# Patient Record
Sex: Female | Born: 1980 | Race: White | Hispanic: No | Smoking: Former smoker
Health system: Southern US, Community
[De-identification: ages and names within clinical notes are randomized; demographics above are authoritative.]

## PROBLEM LIST (undated history)

## (undated) DIAGNOSIS — N809 Endometriosis, unspecified: Secondary | ICD-10-CM

## (undated) DIAGNOSIS — N800 Endometriosis of uterus: Secondary | ICD-10-CM

## (undated) DIAGNOSIS — R112 Nausea with vomiting, unspecified: Secondary | ICD-10-CM

## (undated) DIAGNOSIS — N8003 Adenomyosis of the uterus: Secondary | ICD-10-CM

## (undated) DIAGNOSIS — E049 Nontoxic goiter, unspecified: Secondary | ICD-10-CM

## (undated) DIAGNOSIS — I341 Nonrheumatic mitral (valve) prolapse: Secondary | ICD-10-CM

## (undated) DIAGNOSIS — Z9889 Other specified postprocedural states: Secondary | ICD-10-CM

## (undated) DIAGNOSIS — I499 Cardiac arrhythmia, unspecified: Secondary | ICD-10-CM

## (undated) DIAGNOSIS — E039 Hypothyroidism, unspecified: Secondary | ICD-10-CM

## (undated) HISTORY — DX: Nontoxic goiter, unspecified: E04.9

## (undated) HISTORY — DX: Adenomyosis of the uterus: N80.03

## (undated) HISTORY — DX: Nonrheumatic mitral (valve) prolapse: I34.1

## (undated) HISTORY — PX: WISDOM TOOTH EXTRACTION: SHX21

## (undated) HISTORY — DX: Endometriosis, unspecified: N80.9

## (undated) HISTORY — DX: Endometriosis of uterus: N80.0

---

## 2008-07-22 ENCOUNTER — Ambulatory Visit: Payer: Self-pay | Admitting: Family Medicine

## 2009-03-11 ENCOUNTER — Ambulatory Visit: Payer: Self-pay | Admitting: Otolaryngology

## 2010-06-04 IMAGING — CT CT NECK WITH CONTRAST
2 series · 10 of 14 positions shown, 12 images · IV contrast (agent unspecified)
Comparison: none

REASON FOR EXAM: right neck mass
COMMENTS:

PROCEDURE:     SINALOA - SINALOA NECK WITH CONTRAST  - March 11, 2009 [DATE]
RESULT:     CT of the neck is performed utilizing 75 ml of Ksovue-B76
iodinated intravenous contrast. Images are reconstructed in the axial plane
at 3 mm slice thickness.
There is no previous exam for comparison.

[Series 2: soft tissue · axial · 0.51mm/px · z∈[-370,-152]mm · 8 of 95 slices shown, 10 images]
[im 11/95  soft-tissue]
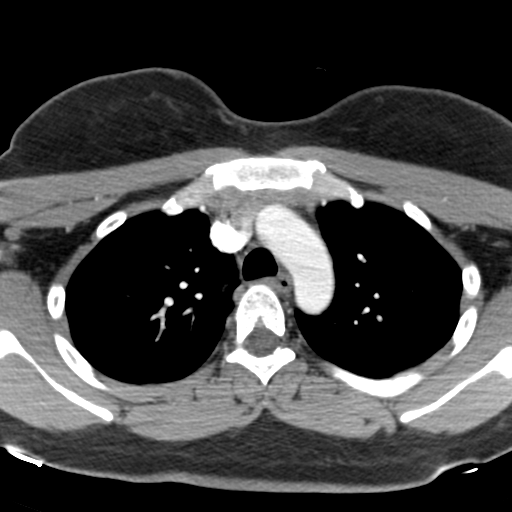
[im 11/95  bone]
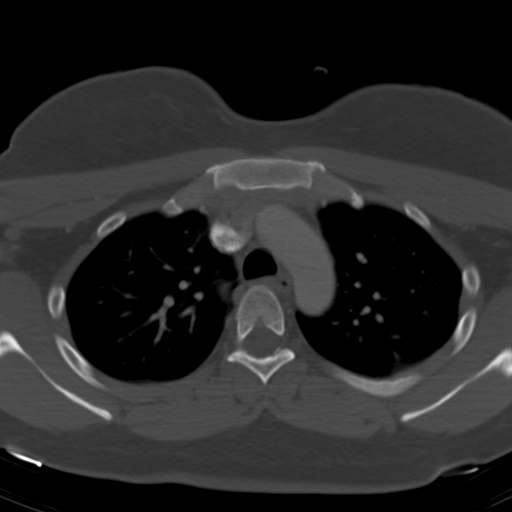
[im 21/95  bone]
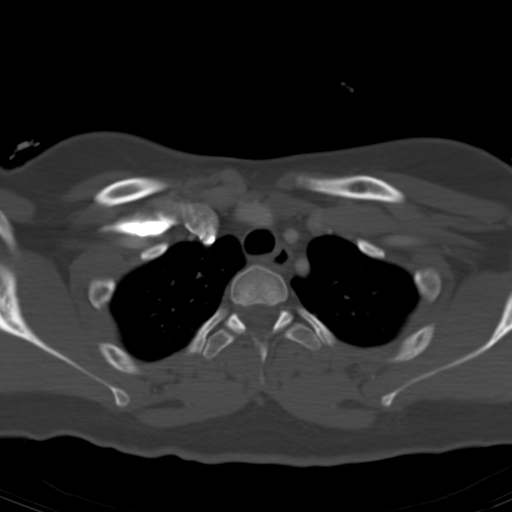
[im 32/95  bone]
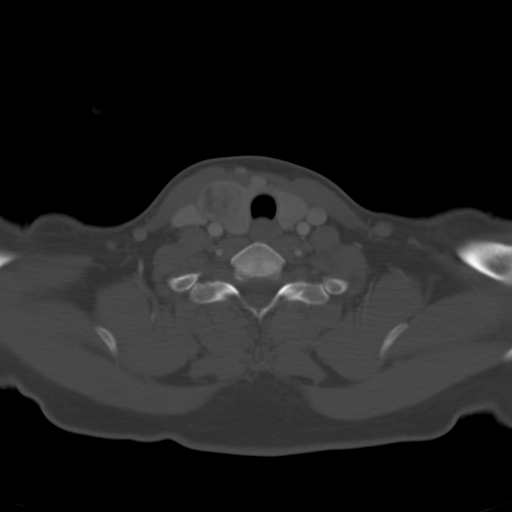
[im 42/95  bone]
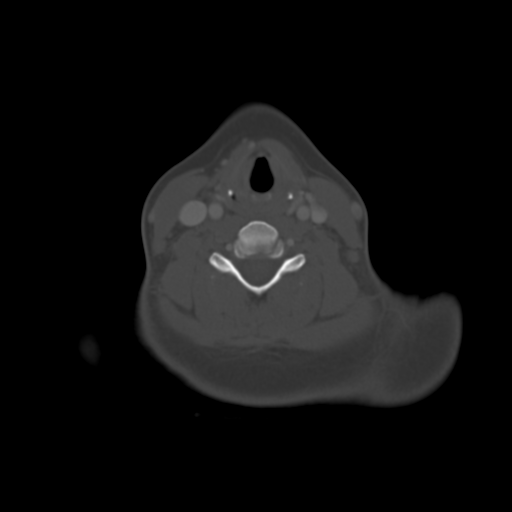
[im 53/95  soft-tissue]
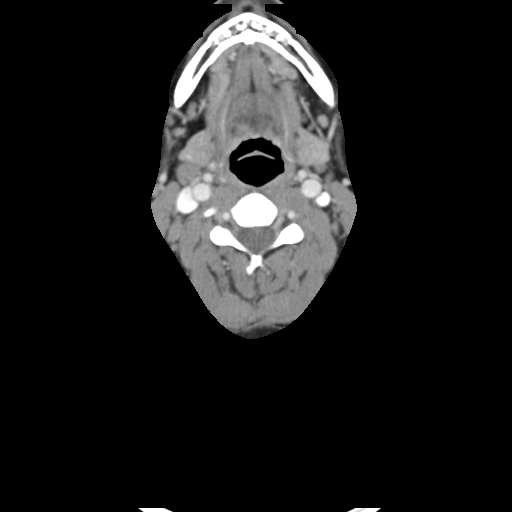
[im 53/95  bone]
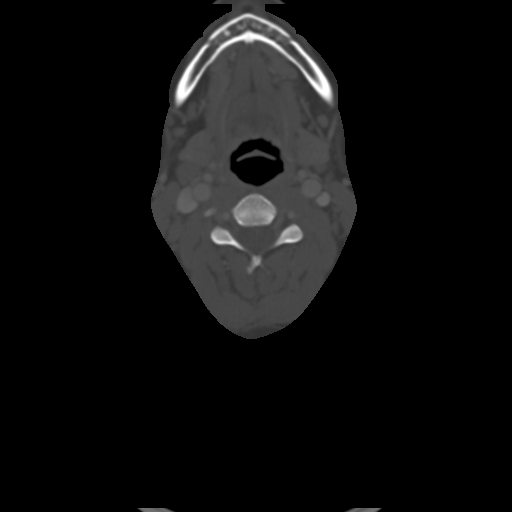
[im 63/95  bone]
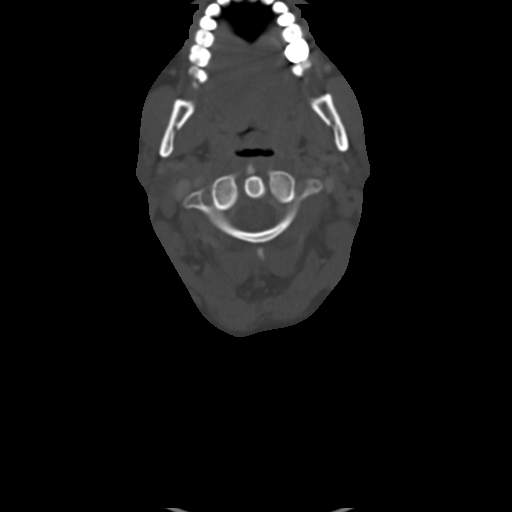
[im 74/95  bone]
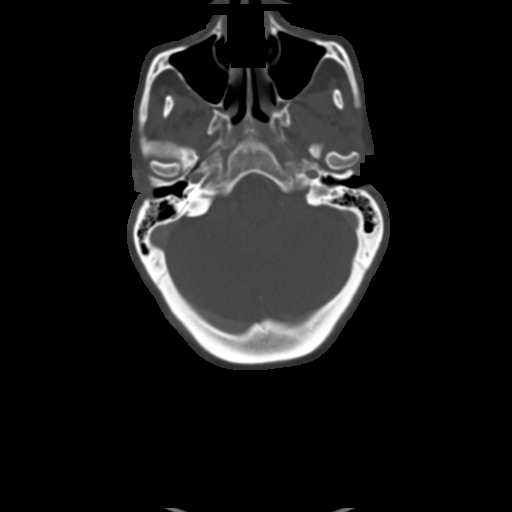
[im 84/95  bone]
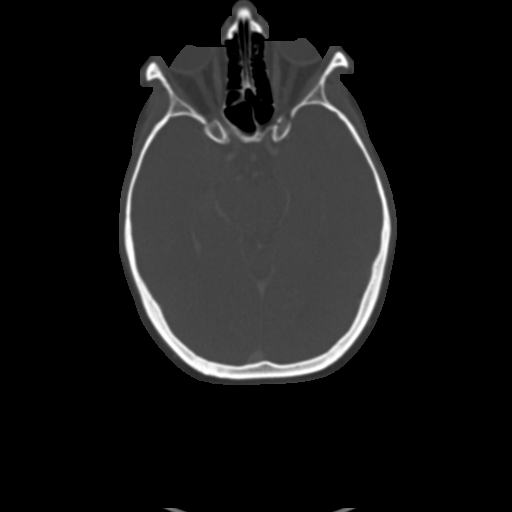

[Series 3: lung · axial · 0.61mm/px · z∈[-370,-338]mm · 2 of 33 slices shown]
[im 11/33  bone]
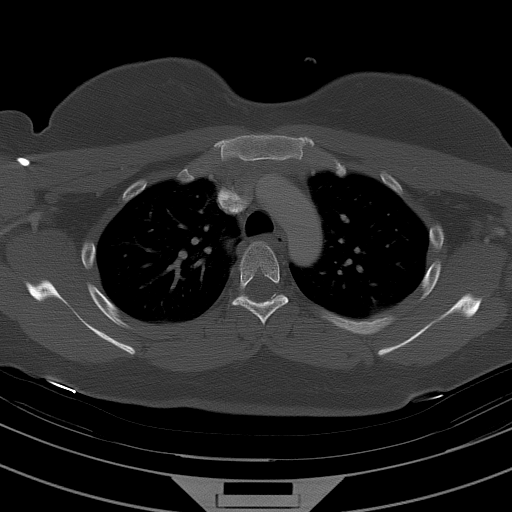
[im 22/33  bone]
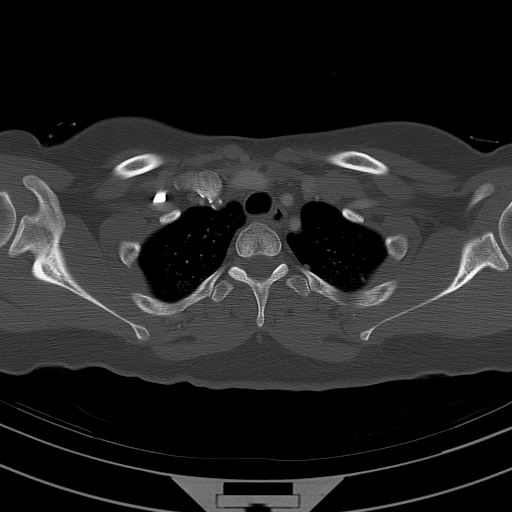

[10 of 14 positions shown; findings below may reference images not displayed]

FINDINGS: A skin marker is placed on the right neck anterolaterally on image
#59 and image #60. This overlies an enlarged, heterogeneously enhancing,
right lobe of the thyroid. There does not appear to be definite
calcification. The appearance is nonspecific. The left lobe is at the upper
limits of normal in size but smaller. Images through the base of the brain
demonstrate normal enhancement with no discrete mass. The included orbits
and sinuses are unremarkable. The nasopharyngeal and oropharyngeal soft
tissues appear to be within normal limits. The prevertebral and
parapharyngeal regions are unremarkable. The parotid and submandibular
glands appear to be within normal limits. There is no definite cervical mass
or adenopathy otherwise. There is no superior mediastinal extension of the
thyroid lobes. The superior mediastinum is unremarkable otherwise. There is
no supraclavicular mass.
IMPRESSION: Enlarged right lobe of the thyroid which is heterogeneous
in enhancement. Ultrasound could be utilized for further characterization,
if desired. Malignancy is not excluded but the appearance is nonspecific.
Statistically, the appearance is most likely secondary to multinodular
disease but sonographic correlation looking for small areas of calcification
is suggested. If calcification is present then fine needle aspiration biopsy
would be suggested, if it has not been performed already.

## 2012-08-31 HISTORY — PX: THYROIDECTOMY: SHX17

## 2014-09-04 LAB — HM PAP SMEAR: HM PAP: NORMAL

## 2016-11-13 ENCOUNTER — Ambulatory Visit (INDEPENDENT_AMBULATORY_CARE_PROVIDER_SITE_OTHER): Payer: BC Managed Care – PPO | Admitting: Obstetrics and Gynecology

## 2016-11-13 ENCOUNTER — Encounter: Payer: Self-pay | Admitting: Obstetrics and Gynecology

## 2016-11-13 VITALS — BP 100/60 | HR 63 | Ht 67.0 in | Wt 161.0 lb

## 2016-11-13 DIAGNOSIS — Z0000 Encounter for general adult medical examination without abnormal findings: Secondary | ICD-10-CM

## 2016-11-13 DIAGNOSIS — I341 Nonrheumatic mitral (valve) prolapse: Secondary | ICD-10-CM

## 2016-11-13 DIAGNOSIS — Z124 Encounter for screening for malignant neoplasm of cervix: Secondary | ICD-10-CM | POA: Diagnosis not present

## 2016-11-13 DIAGNOSIS — N8003 Adenomyosis of the uterus: Secondary | ICD-10-CM

## 2016-11-13 DIAGNOSIS — N809 Endometriosis, unspecified: Secondary | ICD-10-CM

## 2016-11-13 DIAGNOSIS — N800 Endometriosis of uterus: Secondary | ICD-10-CM | POA: Diagnosis not present

## 2016-11-13 DIAGNOSIS — L680 Hirsutism: Secondary | ICD-10-CM

## 2016-11-13 DIAGNOSIS — Z113 Encounter for screening for infections with a predominantly sexual mode of transmission: Secondary | ICD-10-CM

## 2016-11-13 NOTE — Progress Notes (Signed)
Gynecology Annual Exam  PCP: Cathey Endow, MD  Chief Complaint  Patient presents with  . Gynecologic Exam    History of Present Illness:  [name] is a 36 y.o. [phone] who LMP was Patient's last menstrual period was 11/10/2016., presents today for her annual examination.  Her menses are regular every 28-30 days, lasting 7 day(s).  Dysmenorrhea mild, occurring throughout menses. She does not have intermenstrual bleeding.  She is single partner, contraception - IUD.  Last Pap: 2 years ago  Results were: no abnormalities /neg HPV DNA neg Hx of STDs: none. But requests testing every year.  Last mammogram: n/a  Results were: n/a There is no FH of breast cancer. There is no FH of ovarian cancer. The patient does not do self-breast exams.  Tobacco use: The patient denies current or previous tobacco use. Alcohol use: none Exercise: moderately active  The patient reports facial and breast hair growth with acne. Has been tested for PCOS in the past with negative results.  Wants testing again for hyperandrogenemia.   The patient wears seatbelts: yes.      Review of Systems: Review of Systems  Constitutional: Negative.   HENT: Negative.   Eyes: Negative.   Respiratory: Negative.   Cardiovascular: Negative.   Gastrointestinal: Negative.   Genitourinary: Negative.   Musculoskeletal: Negative.   Skin:       Unwanted facial and breast hair growth  Neurological: Negative.   Endo/Heme/Allergies: Negative.   Psychiatric/Behavioral: Negative.     Past Medical History:  Diagnosis Date  . Goiter   . Mitral valve prolapse    Past Surgical History:  Procedure Laterality Date  . THYROIDECTOMY      Medications:   Medication Sig Start Date End Date Taking? Authorizing Provider  levonorgestrel (MIRENA) 20 MCG/24HR IUD 1 each by Intrauterine route once.   Yes Historical Provider, MD  levothyroxine (SYNTHROID, LEVOTHROID) 137 MCG tablet TAKE (1) TABLET BY MOUTH EVERY DAY AT  6:00 10/09/16  Yes Historical Provider, MD  Multiple Vitamin (MULTI-VITAMINS) TABS Take by mouth.    Historical Provider, MD    Allergies: No Known Allergies  Gynecologic History: Patient's last menstrual period was 11/10/2016.  Obstetric History: [id number]  Social History   Social History  . Marital status: Married    Spouse name: N/A  . Number of children: N/A  . Years of education: N/A   Occupational History  . Not on file.   Social History Main Topics  . Smoking status: Never Smoker  . Smokeless tobacco: Never Used  . Alcohol use Yes  . Drug use: No  . Sexual activity: Yes    Birth control/ protection: IUD   Other Topics Concern  . Not on file   Social History Narrative  . No narrative on file   Family History  Problem Relation Age of Onset  . Diabetes Father   . Bladder Cancer Father   . Ovarian cancer Maternal Aunt   . Breast cancer Maternal Grandmother   . Colon cancer Maternal Uncle      Physical Exam BP 100/60   Pulse 63   Ht 5\' 7"  (1.702 m)   Wt 161 lb (73 kg)   LMP 11/10/2016   BMI 25.22 kg/m   Physical Exam  Constitutional: She is oriented to person, place, and time and well-developed, well-nourished, and in no distress. No distress.  HENT:  Head: Normocephalic and atraumatic.  Eyes: Conjunctivae are normal. Left eye exhibits no discharge. No scleral icterus.  Neck: Normal range of motion. Neck supple.  Cardiovascular: Normal rate and regular rhythm.  Exam reveals no gallop and no friction rub.   No murmur heard. Pulmonary/Chest: Effort normal and breath sounds normal. No respiratory distress. She has no wheezes. She has no rales. She exhibits no mass and no tenderness. Right breast exhibits no inverted nipple, no mass, no nipple discharge, no skin change and no tenderness. Left breast exhibits no inverted nipple, no mass, no nipple discharge, no skin change and no tenderness. Breasts are symmetrical.  Abdominal: Soft. Bowel sounds are normal. She  exhibits no distension and no mass. There is no tenderness. There is no rebound and no guarding.  Genitourinary: Vagina normal, uterus normal, cervix normal, right adnexa normal, left adnexa normal and vulva normal.  Genitourinary Comments: IUD strings present  Musculoskeletal: Normal range of motion. She exhibits no edema.  Lymphadenopathy:    She has no cervical adenopathy.  Neurological: She is alert and oriented to person, place, and time. No cranial nerve deficit.  Skin: Skin is warm and dry. No rash noted.  Psychiatric: Judgment normal.     Female chaperone present for pelvic and breast  portions of the physical exam  Results: AUDIT Questionnaire (screen for alcoholism): 2 PHQ-9: 5  Assessment: 36 y.o. [id number] here for routine annual exam.   Plan: 1. Adenomyosis Continue IUD  3. Hirsutism - 17-Hydroxyprogesterone - DHEA-sulfate - Hgb A1c w/o eAG - Prolactin - Testosterone,Free and Total  4. Laboratory exam ordered as part of routine general medical examination - Lipid Panel With LDL/HDL Ratio  5. Pap smear for cervical cancer screening - IGP,CtNg,AptimaHPV,rfx16/18,45  6. Screen for STD (sexually transmitted disease) - IGP,CtNg,AptimaHPV,rfx16/18,45   7) STI screening was offered: accepted  8) Pap - ASCCP guidelines and rational discussed.  Patient opts for yearly screening interval  9) Routine healthcare maintenance including cholesterol, diabetes screening done today as part of PCOS panel  10) Follow up 1 year for routine annual exam  Prentice Docker, MD 11/13/2016 2:46 PM

## 2016-11-18 LAB — 17-HYDROXYPROGESTERONE: 17 HYDROXYPROGESTERONE: 36 ng/dL

## 2016-11-18 LAB — TESTOSTERONE,FREE AND TOTAL
Testosterone, Free: 2.2 pg/mL (ref 0.0–4.2)
Testosterone: 35 ng/dL (ref 8–48)

## 2016-11-18 LAB — LIPID PANEL WITH LDL/HDL RATIO
Cholesterol, Total: 179 mg/dL (ref 100–199)
HDL: 52 mg/dL (ref >39)
LDL CALC: 107 mg/dL — AB (ref 0–99)
LDL/HDL RATIO: 2.1 ratio (ref 0.0–3.2)
TRIGLYCERIDES: 98 mg/dL (ref 0–149)
VLDL CHOLESTEROL CAL: 20 mg/dL (ref 5–40)

## 2016-11-18 LAB — PROLACTIN: Prolactin: 10.6 ng/mL (ref 4.8–23.3)

## 2016-11-18 LAB — HGB A1C W/O EAG: Hgb A1c MFr Bld: 4.6 % — ABNORMAL LOW (ref 4.8–5.6)

## 2016-11-18 LAB — DHEA-SULFATE: DHEA SO4: 184.9 ug/dL (ref 57.3–279.2)

## 2016-11-19 ENCOUNTER — Telehealth: Payer: Self-pay

## 2016-11-19 ENCOUNTER — Encounter: Payer: Self-pay | Admitting: Obstetrics and Gynecology

## 2016-11-19 DIAGNOSIS — L680 Hirsutism: Secondary | ICD-10-CM

## 2016-11-19 NOTE — Telephone Encounter (Signed)
Pt returning SDJ regarding lab results.

## 2016-11-19 NOTE — Telephone Encounter (Signed)
Spoke w/pt. Notified that SDJ replied to patients My chart message. Patient states she hasn't received a notification of this as of yet. She would like a call back from SDJ to discuss.

## 2016-11-20 DIAGNOSIS — L680 Hirsutism: Secondary | ICD-10-CM | POA: Insufficient documentation

## 2016-11-20 MED ORDER — NORGESTIMATE-ETH ESTRADIOL 0.25-35 MG-MCG PO TABS: 1.0000 | ORAL_TABLET | Freq: Every day | ORAL | 4 refills | Status: DC

## 2016-11-20 NOTE — Telephone Encounter (Signed)
Discussed results. Difficult to make definitive diagnosis of PCOS overall. 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

## 2016-11-27 LAB — IGP,CTNG,APTIMAHPV,RFX16/18,45
CHLAMYDIA, NUC. ACID AMP: NEGATIVE
Gonococcus by Nucleic Acid Amp: NEGATIVE
HPV Aptima: POSITIVE — AB
HPV Genotype 16: NEGATIVE
HPV Genotype 18,45: NEGATIVE
PAP SMEAR COMMENT: 0

## 2016-12-01 ENCOUNTER — Telehealth: Payer: Self-pay | Admitting: Obstetrics and Gynecology

## 2016-12-01 ENCOUNTER — Encounter: Payer: Self-pay | Admitting: Obstetrics and Gynecology

## 2016-12-01 NOTE — Telephone Encounter (Signed)
Spoke with patient and conveyed pap smear results. Answered all questions. Recommended follow up in 1 year for repeat pap smear with HPV.

## 2017-02-10 ENCOUNTER — Encounter: Payer: Self-pay | Admitting: Obstetrics and Gynecology

## 2017-02-19 ENCOUNTER — Encounter: Payer: Self-pay | Admitting: Obstetrics and Gynecology

## 2017-09-07 ENCOUNTER — Other Ambulatory Visit: Payer: Self-pay | Admitting: Obstetrics and Gynecology

## 2017-09-07 DIAGNOSIS — L680 Hirsutism: Secondary | ICD-10-CM

## 2017-11-15 ENCOUNTER — Encounter: Payer: Self-pay | Admitting: Obstetrics and Gynecology

## 2017-11-15 ENCOUNTER — Ambulatory Visit (INDEPENDENT_AMBULATORY_CARE_PROVIDER_SITE_OTHER): Payer: BC Managed Care – PPO | Admitting: Obstetrics and Gynecology

## 2017-11-15 VITALS — BP 122/78 | Ht 67.0 in | Wt 170.0 lb

## 2017-11-15 DIAGNOSIS — Z1339 Encounter for screening examination for other mental health and behavioral disorders: Secondary | ICD-10-CM | POA: Diagnosis not present

## 2017-11-15 DIAGNOSIS — Z01419 Encounter for gynecological examination (general) (routine) without abnormal findings: Secondary | ICD-10-CM | POA: Diagnosis not present

## 2017-11-15 DIAGNOSIS — Z124 Encounter for screening for malignant neoplasm of cervix: Secondary | ICD-10-CM | POA: Diagnosis not present

## 2017-11-15 DIAGNOSIS — L680 Hirsutism: Secondary | ICD-10-CM

## 2017-11-15 DIAGNOSIS — N809 Endometriosis, unspecified: Secondary | ICD-10-CM

## 2017-11-15 DIAGNOSIS — Z1331 Encounter for screening for depression: Secondary | ICD-10-CM

## 2017-11-15 DIAGNOSIS — N800 Endometriosis of uterus: Secondary | ICD-10-CM | POA: Diagnosis not present

## 2017-11-15 DIAGNOSIS — N8003 Adenomyosis of the uterus: Secondary | ICD-10-CM

## 2017-11-15 DIAGNOSIS — Z113 Encounter for screening for infections with a predominantly sexual mode of transmission: Secondary | ICD-10-CM

## 2017-11-15 MED ORDER — NORGESTIMATE-ETH ESTRADIOL 0.25-35 MG-MCG PO TABS: 1.0000 | ORAL_TABLET | Freq: Every day | ORAL | 4 refills | Status: DC

## 2017-11-15 NOTE — Progress Notes (Signed)
Gynecology Annual Exam   PCP: Gale Journey, MD  Chief Complaint  Patient presents with  . Annual Exam   History of Present Illness:  [name] is a 37 y.o. [id number] who LMP was Patient's last menstrual period was 11/08/2017., presents today for her annual examination.  Her menses are regular every 28-30 days, lasting 7 day(s).  Dysmenorrhea moderate, occurring first 1-2 days of flow. She does have intermenstrual bleeding, occasionally.  She is functional, but is tired of dealing with having to take medications.  Her bleeding has been a long-standing issue for her.  Her IUD has helped greatly and she has used a variety of combined OCPs to manage the overall bleeding issue for several years. She is believed to have adenomyosis based on ultrasound.  She is single partner, contraception - IUD. Ortho-cyclen.   Last Pap: 1 year  Results were: no abnormalities /neg HPV DNA Positive (HPV 16 and 18 negative).  Hx of STDs: none  Last mammogram: n/a There is no FH of breast cancer. There is no FH of ovarian cancer. The patient does not do self-breast exams.  Tobacco use: The patient denies current or previous tobacco use. Alcohol use: social drinker Exercise: moderately active  The patient wears seatbelts: yes.      Contraception, Mirena, HPV results, surgery  She is seeing psychiatry for burnout symptoms.  She is taking no medications.  She scored #9 a "1" due to a "thought pattern." she says that she is safe and would never hurt herself. She has recently started her visits to a psychiatrist.   Past Medical History:  Diagnosis Date  . Adenomyosis   . Goiter   . Mitral valve prolapse     Past Surgical History:  Procedure Laterality Date  . THYROIDECTOMY      Prior to Admission medications   Medication Sig Start Date End Date Taking? Authorizing Provider  levonorgestrel (MIRENA) 20 MCG/24HR IUD 1 each by Intrauterine route once.   Yes [provider]    levothyroxine (SYNTHROID, LEVOTHROID) 137 MCG tablet TAKE (1) TABLET BY MOUTH EVERY DAY AT 6:00 10/09/16  Yes [provider]  Multiple Vitamin (MULTI-VITAMINS) TABS Take by mouth.   Yes [provider]  norgestimate-ethinyl estradiol (ORTHO-CYCLEN,SPRINTEC,PREVIFEM) 0.25-35 MG-MCG tablet Take 1 tablet by mouth daily. 11/20/16  Yes Will Bonnet, MD   Allergies: No Known Allergies  Gynecologic History: Patient's last menstrual period was 11/08/2017.  Obstetric History: [id number]  Social History   Socioeconomic History  . Marital status: Married    Spouse name: Not on file  . Number of children: Not on file  . Years of education: Not on file  . Highest education level: Not on file  Social Needs  . Financial resource strain: Not on file  . Food insecurity - worry: Not on file  . Food insecurity - inability: Not on file  . Transportation needs - medical: Not on file  . Transportation needs - non-medical: Not on file  Occupational History  . Not on file  Tobacco Use  . Smoking status: Never Smoker  . Smokeless tobacco: Never Used  Substance and Sexual Activity  . Alcohol use: Yes  . Drug use: No  . Sexual activity: Yes    Birth control/protection: IUD  Other Topics Concern  . Not on file  Social History Narrative  . Not on file    Family History  Problem Relation Age of Onset  . Diabetes Father   . Bladder  Cancer Father   . Ovarian cancer Maternal Aunt   . Breast cancer Maternal Grandmother   . Colon cancer Maternal Uncle     Review of Systems  Constitutional: Negative.   HENT: Negative.   Eyes: Negative.   Respiratory: Negative.   Cardiovascular: Negative.   Gastrointestinal: Negative.   Genitourinary: Negative.   Musculoskeletal: Negative.   Skin: Negative.   Neurological: Negative.   Psychiatric/Behavioral: Negative.      Physical Exam BP 122/78   Ht 5\' 7"  (1.702 m)   Wt 170 lb (77.1 kg)   LMP 11/08/2017   BMI 26.63 kg/m     Physical Exam  Constitutional: She is oriented to person, place, and time. She appears well-developed and well-nourished. No distress.  Genitourinary: Uterus normal. Pelvic exam was performed with patient supine. There is no rash, tenderness, lesion or injury on the right labia. There is no rash, tenderness, lesion or injury on the left labia. No erythema, tenderness or bleeding in the vagina. No signs of injury around the vagina. No vaginal discharge found. Right adnexum does not display mass, does not display tenderness and does not display fullness. Left adnexum does not display mass, does not display tenderness and does not display fullness. Cervix does not exhibit motion tenderness, lesion, discharge or polyp.   Uterus is mobile and anteverted. Uterus is not enlarged, tender or exhibiting a mass.  HENT:  Head: Normocephalic and atraumatic.  Eyes: EOM are normal. No scleral icterus.  Neck: Normal range of motion. Neck supple. No thyromegaly present.  Cardiovascular: Normal rate and regular rhythm. Exam reveals no gallop and no friction rub.  No murmur heard. Pulmonary/Chest: Effort normal and breath sounds normal. No respiratory distress. She has no wheezes. She has no rales. Right breast exhibits no inverted nipple, no mass, no nipple discharge, no skin change and no tenderness. Left breast exhibits no inverted nipple, no mass, no nipple discharge, no skin change and no tenderness.  Abdominal: Soft. Bowel sounds are normal. She exhibits no distension and no mass. There is no tenderness. There is no rebound and no guarding.  Musculoskeletal: Normal range of motion. She exhibits no edema or tenderness.  Lymphadenopathy:    She has no cervical adenopathy.       Right: No inguinal adenopathy present.       Left: No inguinal adenopathy present.  Neurological: She is alert and oriented to person, place, and time. No cranial nerve deficit.  Skin: Skin is warm and dry. No rash noted. No erythema.   Psychiatric: She has a normal mood and affect. Her behavior is normal. Judgment normal.   Female chaperone present for pelvic and breast  portions of the physical exam  Results: AUDIT Questionnaire (screen for alcoholism): 1 PHQ-9: 11 (seeing a psychiatrist)  Assessment: 37 y.o. [phone] female here for routine annual gynecologic examination.  Plan: Problem List Items Addressed This Visit      Musculoskeletal and Integument   Hirsutism   Relevant Medications   norgestimate-ethinyl estradiol (ORTHO-CYCLEN,SPRINTEC,PREVIFEM) 0.25-35 MG-MCG tablet     Other   Adenomyosis   Relevant Medications   norgestimate-ethinyl estradiol (ORTHO-CYCLEN,SPRINTEC,PREVIFEM) 0.25-35 MG-MCG tablet    Other Visit Diagnoses    Women's annual routine gynecological examination    -  Primary   Relevant Orders   IGP,CtNgTv,Apt HPV,rfx16/18,45   Screening for depression       Screening for alcoholism       Pap smear for cervical cancer screening       Relevant  Orders   IGP,CtNgTv,Apt HPV,rfx16/18,45   Screen for STD (sexually transmitted disease)       Relevant Orders   IGP,CtNgTv,Apt HPV,rfx16/18,45     Screening: -- Blood pressure screen normal -- Colonoscopy - not due -- Mammogram - not due -- Weight screening: normal -- Depression screening negative (PHQ-9) -- Nutrition: normal -- cholesterol screening: per PCP -- osteoporosis screening: not due -- tobacco screening: not using -- alcohol screening: AUDIT questionnaire indicates low-risk usage. -- family history of breast cancer screening: done. not at high risk. -- no evidence of domestic violence or intimate partner violence. -- STD screening: gonorrhea/chlamydia NAAT collected -- pap smear collected per ASCCP guidelines -- flu vaccine received at work -- HPV vaccination series: not eligilbe   Adenomyosis: discussed in detail. She is ready for surgery. She will consider and may time surgery (hysterectomy) for summer. WIll continue to  monitor in the mean time. Her IUD is due to come out in 03/2018.    Prentice Docker, MD 11/15/2017 1:27 PM

## 2017-11-18 LAB — IGP,CTNGTV,APT HPV,RFX16/18,45
Chlamydia, Nuc. Acid Amp: NEGATIVE
Gonococcus, Nuc. Acid Amp: NEGATIVE
HPV Aptima: NEGATIVE
PAP Smear Comment: 0
TRICH VAG BY NAA: NEGATIVE

## 2017-11-22 ENCOUNTER — Encounter: Payer: Self-pay | Admitting: Obstetrics and Gynecology

## 2017-11-24 ENCOUNTER — Telehealth: Payer: Self-pay | Admitting: Obstetrics and Gynecology

## 2017-11-24 NOTE — Telephone Encounter (Signed)
-----   Message from Will Bonnet, MD sent at 11/23/2017  3:10 PM EDT ----- Regarding: Schedule surgery Surgery Booking Request Patient Full Name:  Andrea Decker  MRN: [id number]  DOB: [birth year]  Surgeon: Prentice Docker, MD  Requested Surgery Date and Time: mid July, on the same day another MD is in the OR with me (pt shooting for week of 7/15) Primary Diagnosis AND Code: adenomyosis, menorrhagia Secondary Diagnosis and Code:  Surgical Procedure: TLH/BS, cystoscopy L&D Notification: No Admission Status: same day surgery Length of Surgery: 2 hours Special Case Needs: none H&P: tbd (date) Phone Interview???: no Interpreter: Language:  Medical Clearance: no Special Scheduling Instructions: none

## 2017-11-24 NOTE — Telephone Encounter (Signed)
-----   Message from Will Bonnet, MD sent at 11/23/2017  3:10 PM EDT ----- Regarding: Schedule surgery Surgery Booking Request Patient Full Name:  Andrea Decker  MRN: [id number]  DOB: [birth year]  Surgeon: Prentice Docker, MD  Requested Surgery Date and Time: mid July, on the same day another MD is in the OR with me (pt shooting for week of 7/15) Primary Diagnosis AND Code: adenomyosis, menorrhagia Secondary Diagnosis and Code:  Surgical Procedure: TLH/BS, cystoscopy L&D Notification: No Admission Status: same day surgery Length of Surgery: 2 hours Special Case Needs: none H&P: tbd (date) Phone Interview???: no Interpreter: Language:  Medical Clearance: no Special Scheduling Instructions: none

## 2017-11-24 NOTE — Telephone Encounter (Signed)
Lmtrc

## 2017-11-25 NOTE — Telephone Encounter (Signed)
Patient is aware of H&P at [facility] on 03/04/18 @ 8:50am, Pre-admit Testing afterwards, and OR on 03/15/18. Patient is aware she may expect calls from the [facility] and [facility]. Patient has my ext.

## 2017-11-25 NOTE — Telephone Encounter (Signed)
Lmtrc

## 2017-12-05 ENCOUNTER — Encounter: Payer: Self-pay | Admitting: Obstetrics and Gynecology

## 2018-01-09 ENCOUNTER — Encounter: Payer: Self-pay | Admitting: Obstetrics and Gynecology

## 2018-02-10 ENCOUNTER — Telehealth: Payer: Self-pay

## 2018-02-10 NOTE — Telephone Encounter (Signed)
FMLA/DISABILITY form for [employer] filled out, signature obtained, and given to [place] for processing.

## 2018-02-14 ENCOUNTER — Telehealth: Payer: Self-pay | Admitting: Obstetrics and Gynecology

## 2018-02-14 NOTE — Telephone Encounter (Signed)
Lm for pt letting her know that her FMLA paperwork has been faxed.

## 2018-03-04 ENCOUNTER — Other Ambulatory Visit: Payer: Self-pay

## 2018-03-04 ENCOUNTER — Encounter
Admission: RE | Admit: 2018-03-04 | Discharge: 2018-03-04 | Disposition: A | Discharge status: Home / Self Care | Payer: BC Managed Care – PPO | Source: Ambulatory Visit | Attending: Obstetrics and Gynecology | Admitting: Obstetrics and Gynecology

## 2018-03-04 ENCOUNTER — Ambulatory Visit (INDEPENDENT_AMBULATORY_CARE_PROVIDER_SITE_OTHER): Payer: BC Managed Care – PPO | Admitting: Obstetrics and Gynecology

## 2018-03-04 ENCOUNTER — Encounter: Payer: Self-pay | Admitting: Obstetrics and Gynecology

## 2018-03-04 VITALS — BP 104/68 | HR 69 | Ht 67.0 in | Wt 169.0 lb

## 2018-03-04 DIAGNOSIS — N921 Excessive and frequent menstruation with irregular cycle: Secondary | ICD-10-CM

## 2018-03-04 DIAGNOSIS — Z01812 Encounter for preprocedural laboratory examination: Secondary | ICD-10-CM | POA: Diagnosis present

## 2018-03-04 DIAGNOSIS — N800 Endometriosis of uterus: Secondary | ICD-10-CM

## 2018-03-04 DIAGNOSIS — N809 Endometriosis, unspecified: Secondary | ICD-10-CM

## 2018-03-04 DIAGNOSIS — N8003 Adenomyosis of the uterus: Secondary | ICD-10-CM

## 2018-03-04 HISTORY — DX: Nausea with vomiting, unspecified: R11.2

## 2018-03-04 HISTORY — DX: Other specified postprocedural states: Z98.890

## 2018-03-04 LAB — CBC
HEMATOCRIT: 42.7 % (ref 35.0–47.0)
Hemoglobin: 14.9 g/dL (ref 12.0–16.0)
MCH: 29.3 pg (ref 26.0–34.0)
MCHC: 34.9 g/dL (ref 32.0–36.0)
MCV: 84.1 fL (ref 80.0–100.0)
PLATELETS: 260 10*3/uL (ref 150–440)
RBC: 5.08 MIL/uL (ref 3.80–5.20)
RDW: 13.2 % (ref 11.5–14.5)
WBC: 6.6 10*3/uL (ref 3.6–11.0)

## 2018-03-04 LAB — TYPE AND SCREEN
ABO/RH(D): AB POS
Antibody Screen: NEGATIVE

## 2018-03-04 LAB — COMPREHENSIVE METABOLIC PANEL
ALT: 27 U/L (ref 0–44)
AST: 20 U/L (ref 15–41)
Albumin: 4.1 g/dL (ref 3.5–5.0)
Alkaline Phosphatase: 52 U/L (ref 38–126)
Anion gap: 7 (ref 5–15)
BILIRUBIN TOTAL: 0.9 mg/dL (ref 0.3–1.2)
BUN: 14 mg/dL (ref 6–20)
CO2: 28 mmol/L (ref 22–32)
CREATININE: 0.68 mg/dL (ref 0.44–1.00)
Calcium: 8.9 mg/dL (ref 8.9–10.3)
Chloride: 103 mmol/L (ref 98–111)
GFR calc Af Amer: >60 mL/min (ref >60)
Glucose, Bld: 92 mg/dL (ref 70–99)
Potassium: 4 mmol/L (ref 3.5–5.1)
Sodium: 138 mmol/L (ref 135–145)
TOTAL PROTEIN: 7 g/dL (ref 6.5–8.1)

## 2018-03-04 LAB — T4, FREE: Free T4: 1.12 ng/dL (ref 0.82–1.77)

## 2018-03-04 LAB — TSH: TSH: 1.539 u[IU]/mL (ref 0.350–4.500)

## 2018-03-04 NOTE — Progress Notes (Signed)
Preoperative History and Physical  ASHELEY Decker is a 37 y.o. [phone] here for surgical management of menorrhagia with irregular cycle and adenomyosis.   No significant preoperative concerns.  History of Present Illness: 37 y.o. G22P2013 female who has a long-standing history of abnormal uterine bleeding. She has been treated with a Mirena IUD and combined oral contraceptives.  Even with this, she has breakthrough bleeding symptoms.  She had a pelvic ultrasound in the past that showed findings concerning for adenomyosis.  Her Mirena was placed in 03/2013.  Last pap smear 1 year ago, NILM, hPV negative (HPV+ 2 years ago)  Proposed surgery: total laparoscopic hysterectomy, bilateral salgpingectomy, cystoscopy  Past Medical History:  Diagnosis Date  . Adenomyosis   . Goiter   . Mitral valve prolapse    Past Surgical History:  Procedure Laterality Date  . THYROIDECTOMY     OB History  Gravida Para Term Preterm AB Living  5 3 2   1 3   SAB TAB Ectopic Multiple Live Births  1            # Outcome Date GA Lbr Len/2nd Weight Sex Delivery Anes PTL Lv  5 Term 12/12/07 [redacted]w[redacted]d  5 lb 2.1 oz (2.327 kg) M Vag-Spont     4 Term 09/03/05 [redacted]w[redacted]d  5 lb 1.9 oz (2.322 kg) M Vag-Spont     3 Para 12/30/98 [redacted]w[redacted]d  8 lb (3.629 kg)  Vag-Spont     2 Gravida           1 SAB           Patient denies any other pertinent gynecologic issues.   Current Outpatient Medications on File Prior to Visit  Medication Sig Dispense Refill  . levonorgestrel (MIRENA) 20 MCG/24HR IUD 1 each by Intrauterine route once.    Andrea Decker levothyroxine (SYNTHROID, LEVOTHROID) 137 MCG tablet TAKE (1) TABLET BY MOUTH EVERY DAY AT 6:00    . norgestimate-ethinyl estradiol (ORTHO-CYCLEN,SPRINTEC,PREVIFEM) 0.25-35 MG-MCG tablet Take 1 tablet by mouth daily. 3 Package 4   No current facility-administered medications on file prior to visit.    No Known Allergies  Social History:   reports that she quit smoking about 4 years ago. Her smoking use  included cigarettes. She has a 17.00 pack-year smoking history. She has never used smokeless tobacco. She reports that she drinks alcohol. She reports that she does not use drugs.  Family History  Problem Relation Age of Onset  . Diabetes Father   . Bladder Cancer Father   . Ovarian cancer Maternal Aunt   . Breast cancer Maternal Grandmother   . Colon cancer Maternal Uncle     Review of Systems: Noncontributory  PHYSICAL EXAM: Blood pressure 104/68, pulse 69, height 5\' 7"  (1.702 m), weight 169 lb (76.7 kg), SpO2 99 %. CONSTITUTIONAL: Well-developed, well-nourished female in no acute distress.  HENT:  Normocephalic, atraumatic, External right and left ear normal. Oropharynx is clear and moist EYES: Conjunctivae and EOM are normal. Pupils are equal, round, and reactive to light. No scleral icterus.  NECK: Normal range of motion, supple, no masses SKIN: Skin is warm and dry. No rash noted. Not diaphoretic. No erythema. No pallor. [place]: Alert and oriented to person, place, and time. Normal reflexes, muscle tone coordination. No cranial nerve deficit noted. PSYCHIATRIC: Normal mood and affect. Normal behavior. Normal judgment and thought content. CARDIOVASCULAR: Normal heart rate noted, regular rhythm RESPIRATORY: Effort and breath sounds normal, no problems with respiration noted ABDOMEN: Soft, nontender, nondistended.  PELVIC: Deferred MUSCULOSKELETAL: Normal range of motion. No edema and no tenderness. 2+ distal pulses.  Assessment: Patient Active Problem List   Diagnosis Date Noted  . Menorrhagia with irregular cycle 03/04/2018  . Adenomyosis     Plan: Patient will undergo surgical management with TLH/BS/Cystoscopy.   The risks of surgery were discussed in detail with the patient including but not limited to: bleeding which may require transfusion or reoperation; infection which may require antibiotics; injury to surrounding organs which may involve bowel, bladder, ureters ;  need for additional procedures including laparoscopy or laparotomy; thromboembolic phenomenon, surgical site problems and other postoperative/anesthesia complications. Likelihood of success in alleviating the patient's condition was discussed. Routine postoperative instructions will be reviewed with the patient and her family in detail after surgery.  The patient concurred with the proposed plan, giving informed written consent for the surgery.  Preoperative prophylactic antibiotics, as indicated, and SCDs ordered on call to the OR.    Prentice Docker, MD 03/04/2018 9:48 AM

## 2018-03-04 NOTE — H&P (View-Only) (Signed)
Preoperative History and Physical  Andrea Decker is a 37 y.o. [phone] here for surgical management of menorrhagia with irregular cycle and adenomyosis.   No significant preoperative concerns.  History of Present Illness: 37 y.o. G60P2013 female who has a long-standing history of abnormal uterine bleeding. She has been treated with a Mirena IUD and combined oral contraceptives.  Even with this, she has breakthrough bleeding symptoms.  She had a pelvic ultrasound in the past that showed findings concerning for adenomyosis.  Her Mirena was placed in 03/2013.  Last pap smear 1 year ago, NILM, hPV negative (HPV+ 2 years ago)  Proposed surgery: total laparoscopic hysterectomy, bilateral salgpingectomy, cystoscopy  Past Medical History:  Diagnosis Date  . Adenomyosis   . Goiter   . Mitral valve prolapse    Past Surgical History:  Procedure Laterality Date  . THYROIDECTOMY     OB History  Gravida Para Term Preterm AB Living  5 3 2   1 3   SAB TAB Ectopic Multiple Live Births  1            # Outcome Date GA Lbr Len/2nd Weight Sex Delivery Anes PTL Lv  5 Term 12/12/07 [redacted]w[redacted]d  5 lb 2.1 oz (2.327 kg) M Vag-Spont     4 Term 09/03/05 [redacted]w[redacted]d  5 lb 1.9 oz (2.322 kg) M Vag-Spont     3 Para 12/30/98 [redacted]w[redacted]d  8 lb (3.629 kg)  Vag-Spont     2 Gravida           1 SAB           Patient denies any other pertinent gynecologic issues.   Current Outpatient Medications on File Prior to Visit  Medication Sig Dispense Refill  . levonorgestrel (MIRENA) 20 MCG/24HR IUD 1 each by Intrauterine route once.    Marland Kitchen levothyroxine (SYNTHROID, LEVOTHROID) 137 MCG tablet TAKE (1) TABLET BY MOUTH EVERY DAY AT 6:00    . norgestimate-ethinyl estradiol (ORTHO-CYCLEN,SPRINTEC,PREVIFEM) 0.25-35 MG-MCG tablet Take 1 tablet by mouth daily. 3 Package 4   No current facility-administered medications on file prior to visit.    No Known Allergies  Social History:   reports that she quit smoking about 4 years ago. Her smoking use  included cigarettes. She has a 17.00 pack-year smoking history. She has never used smokeless tobacco. She reports that she drinks alcohol. She reports that she does not use drugs.  Family History  Problem Relation Age of Onset  . Diabetes Father   . Bladder Cancer Father   . Ovarian cancer Maternal Aunt   . Breast cancer Maternal Grandmother   . Colon cancer Maternal Uncle     Review of Systems: Noncontributory  PHYSICAL EXAM: Blood pressure 104/68, pulse 69, height 5\' 7"  (1.702 m), weight 169 lb (76.7 kg), SpO2 99 %. CONSTITUTIONAL: Well-developed, well-nourished female in no acute distress.  HENT:  Normocephalic, atraumatic, External right and left ear normal. Oropharynx is clear and moist EYES: Conjunctivae and EOM are normal. Pupils are equal, round, and reactive to light. No scleral icterus.  NECK: Normal range of motion, supple, no masses SKIN: Skin is warm and dry. No rash noted. Not diaphoretic. No erythema. No pallor. [place]: Alert and oriented to person, place, and time. Normal reflexes, muscle tone coordination. No cranial nerve deficit noted. PSYCHIATRIC: Normal mood and affect. Normal behavior. Normal judgment and thought content. CARDIOVASCULAR: Normal heart rate noted, regular rhythm RESPIRATORY: Effort and breath sounds normal, no problems with respiration noted ABDOMEN: Soft, nontender, nondistended.  PELVIC: Deferred MUSCULOSKELETAL: Normal range of motion. No edema and no tenderness. 2+ distal pulses.  Assessment: Patient Active Problem List   Diagnosis Date Noted  . Menorrhagia with irregular cycle 03/04/2018  . Adenomyosis     Plan: Patient will undergo surgical management with TLH/BS/Cystoscopy.   The risks of surgery were discussed in detail with the patient including but not limited to: bleeding which may require transfusion or reoperation; infection which may require antibiotics; injury to surrounding organs which may involve bowel, bladder, ureters ;  need for additional procedures including laparoscopy or laparotomy; thromboembolic phenomenon, surgical site problems and other postoperative/anesthesia complications. Likelihood of success in alleviating the patient's condition was discussed. Routine postoperative instructions will be reviewed with the patient and her family in detail after surgery.  The patient concurred with the proposed plan, giving informed written consent for the surgery.  Preoperative prophylactic antibiotics, as indicated, and SCDs ordered on call to the OR.    Prentice Docker, MD 03/04/2018 9:48 AM

## 2018-03-04 NOTE — Patient Instructions (Signed)
Your procedure is scheduled on: Tuesday 03/15/18 Report to [facility]. To find out your arrival time please call [phone] between 1PM - 3PM on Monday 03/14/18.  Remember: Instructions that are not followed completely may result in serious medical risk, up to and including death, or upon the discretion of your surgeon and anesthesiologist your surgery may need to be rescheduled.     _X__ 1. Do not eat food after midnight the night before your procedure.                 No gum chewing or hard candies. You may drink clear liquids up to 2 hours                 before you are scheduled to arrive for your surgery- DO not drink clear                 liquids within 2 hours of the start of your surgery.                 Clear Liquids include:  water, apple juice without pulp, clear carbohydrate                 drink such as Clearfast or Gatorade, Black Coffee or Tea (Do not add                 anything to coffee or tea).  __X__2.  On the morning of surgery brush your teeth with toothpaste and water, you                 may rinse your mouth with mouthwash if you wish.  Do not swallow any              toothpaste of mouthwash.     _X__ 3.  No Alcohol for 24 hours before or after surgery.   _X__ 4.  Do Not Smoke or use e-cigarettes For 24 Hours Prior to Your Surgery.                 Do not use any chewable tobacco products for at least 6 hours prior to                 surgery.  ____  5.  Bring all medications with you on the day of surgery if instructed.   __X__  6.  Notify your doctor if there is any change in your medical condition      (cold, fever, infections).     Do not wear jewelry, make-up, hairpins, clips or nail polish. Do not wear lotions, powders, or perfumes.  Do not shave 48 hours prior to surgery. Men may shave face and neck. Do not bring valuables to the hospital.    [facility] is not responsible for any belongings or  valuables.  Contacts, dentures/partials or body piercings may not be worn into surgery. Bring a case for your contacts, glasses or hearing aids, a denture cup will be supplied. Leave your suitcase in the car. After surgery it may be brought to your room. For patients admitted to the hospital, discharge time is determined by your treatment team.   Patients discharged the day of surgery will not be allowed to drive home.   Please read over the following fact sheets that you were given:   MRSA Information  __X__ Take these medicines the morning of surgery with A SIP OF WATER:  1. Levothyroxine  2.   3.   4.  5.  6.  ____ Fleet Enema (as directed)   __X__ Use CHG Soap/SAGE wipes as directed  ____ Use inhalers on the day of surgery  ____ Stop metformin/Janumet/Farxiga 2 days prior to surgery    ____ Take 1/2 of usual insulin dose the night before surgery. No insulin the morning          of surgery.   ____ Stop Blood Thinners Coumadin/Plavix/Xarelto/Pleta/Pradaxa/Eliquis/Effient/Aspirin  on   Or contact your Surgeon, Cardiologist or Medical Doctor regarding  ability to stop your blood thinners  __X__ Stop Anti-inflammatories 7 days before surgery such as Advil, Ibuprofen, Motrin,  BC or Goodies Powder, Naprosyn, Naproxen, Aleve, Aspirin TYLENOL OK   __X__ Stop all herbal supplements, fish oil or vitamin E until after surgery.    ____ Bring C-Pap to the hospital.

## 2018-03-07 ENCOUNTER — Encounter: Payer: Self-pay | Admitting: Obstetrics and Gynecology

## 2018-03-14 MED ORDER — CEFAZOLIN SODIUM-DEXTROSE 2-4 GM/100ML-% IV SOLN
2.0000 g | INTRAVENOUS | Status: AC
  Administered 2018-03-15: 2 g via INTRAVENOUS

## 2018-03-15 ENCOUNTER — Ambulatory Visit: Payer: BC Managed Care – PPO | Admitting: Certified Registered"

## 2018-03-15 ENCOUNTER — Encounter
Admission: RE | Disposition: A | Discharge status: Home / Self Care | Payer: Self-pay | Source: Ambulatory Visit | Attending: Obstetrics and Gynecology

## 2018-03-15 ENCOUNTER — Encounter: Payer: Self-pay | Admitting: Certified Registered"

## 2018-03-15 ENCOUNTER — Ambulatory Visit
Admission: RE | Admit: 2018-03-15 | Discharge: 2018-03-15 | Disposition: A | Discharge status: Home / Self Care | Payer: BC Managed Care – PPO | Source: Ambulatory Visit | Attending: Obstetrics and Gynecology | Admitting: Obstetrics and Gynecology

## 2018-03-15 DIAGNOSIS — N800 Endometriosis of uterus: Secondary | ICD-10-CM | POA: Diagnosis not present

## 2018-03-15 DIAGNOSIS — E049 Nontoxic goiter, unspecified: Secondary | ICD-10-CM | POA: Diagnosis not present

## 2018-03-15 DIAGNOSIS — Z8041 Family history of malignant neoplasm of ovary: Secondary | ICD-10-CM | POA: Insufficient documentation

## 2018-03-15 DIAGNOSIS — Z79899 Other long term (current) drug therapy: Secondary | ICD-10-CM | POA: Insufficient documentation

## 2018-03-15 DIAGNOSIS — Z8052 Family history of malignant neoplasm of bladder: Secondary | ICD-10-CM | POA: Diagnosis not present

## 2018-03-15 DIAGNOSIS — N8003 Adenomyosis of the uterus: Secondary | ICD-10-CM | POA: Diagnosis present

## 2018-03-15 DIAGNOSIS — Z87891 Personal history of nicotine dependence: Secondary | ICD-10-CM | POA: Insufficient documentation

## 2018-03-15 DIAGNOSIS — Z833 Family history of diabetes mellitus: Secondary | ICD-10-CM | POA: Diagnosis not present

## 2018-03-15 DIAGNOSIS — D251 Intramural leiomyoma of uterus: Secondary | ICD-10-CM | POA: Diagnosis not present

## 2018-03-15 DIAGNOSIS — Z803 Family history of malignant neoplasm of breast: Secondary | ICD-10-CM | POA: Insufficient documentation

## 2018-03-15 DIAGNOSIS — N920 Excessive and frequent menstruation with regular cycle: Secondary | ICD-10-CM | POA: Diagnosis present

## 2018-03-15 DIAGNOSIS — Z30432 Encounter for removal of intrauterine contraceptive device: Secondary | ICD-10-CM | POA: Diagnosis not present

## 2018-03-15 DIAGNOSIS — N921 Excessive and frequent menstruation with irregular cycle: Secondary | ICD-10-CM | POA: Diagnosis not present

## 2018-03-15 DIAGNOSIS — I341 Nonrheumatic mitral (valve) prolapse: Secondary | ICD-10-CM | POA: Insufficient documentation

## 2018-03-15 DIAGNOSIS — N809 Endometriosis, unspecified: Secondary | ICD-10-CM

## 2018-03-15 DIAGNOSIS — N838 Other noninflammatory disorders of ovary, fallopian tube and broad ligament: Secondary | ICD-10-CM | POA: Insufficient documentation

## 2018-03-15 DIAGNOSIS — Z800 Family history of malignant neoplasm of digestive organs: Secondary | ICD-10-CM | POA: Diagnosis not present

## 2018-03-15 HISTORY — DX: Hypothyroidism, unspecified: E03.9

## 2018-03-15 HISTORY — DX: Cardiac arrhythmia, unspecified: I49.9

## 2018-03-15 HISTORY — PX: CYSTOSCOPY: SHX5120

## 2018-03-15 HISTORY — PX: LAPAROSCOPIC HYSTERECTOMY: SHX1926

## 2018-03-15 LAB — POCT PREGNANCY, URINE: Preg Test, Ur: NEGATIVE

## 2018-03-15 LAB — ABO/RH: ABO/RH(D): AB POS

## 2018-03-15 SURGERY — HYSTERECTOMY, TOTAL, LAPAROSCOPIC
Anesthesia: General

## 2018-03-15 MED ORDER — MIDAZOLAM HCL 2 MG/2ML IJ SOLN
INTRAMUSCULAR | Status: DC | PRN
  Administered 2018-03-15: 3 mg via INTRAVENOUS
  Administered 2018-03-15: 2 mg via INTRAVENOUS

## 2018-03-15 MED ORDER — FENTANYL CITRATE (PF) 100 MCG/2ML IJ SOLN
INTRAMUSCULAR | Status: DC | PRN
  Administered 2018-03-15 (×3): 50 ug via INTRAVENOUS
  Administered 2018-03-15: 100 ug via INTRAVENOUS

## 2018-03-15 MED ORDER — SUGAMMADEX SODIUM 200 MG/2ML IV SOLN
INTRAVENOUS | Status: DC | PRN
  Administered 2018-03-15: 200 mg via INTRAVENOUS

## 2018-03-15 MED ORDER — PHENYLEPHRINE HCL 10 MG/ML IJ SOLN
INTRAMUSCULAR | Status: AC
  Filled 2018-03-15: qty 1

## 2018-03-15 MED ORDER — FENTANYL CITRATE (PF) 100 MCG/2ML IJ SOLN
25.0000 ug | INTRAMUSCULAR | Status: DC | PRN
  Administered 2018-03-15: 25 ug via INTRAVENOUS

## 2018-03-15 MED ORDER — GLYCOPYRROLATE 0.2 MG/ML IJ SOLN
INTRAMUSCULAR | Status: AC
  Filled 2018-03-15: qty 1

## 2018-03-15 MED ORDER — EPHEDRINE SULFATE 50 MG/ML IJ SOLN
INTRAMUSCULAR | Status: AC
  Filled 2018-03-15: qty 1

## 2018-03-15 MED ORDER — LIDOCAINE HCL (CARDIAC) PF 100 MG/5ML IV SOSY
PREFILLED_SYRINGE | INTRAVENOUS | Status: DC | PRN
  Administered 2018-03-15: 50 mg via INTRAVENOUS

## 2018-03-15 MED ORDER — PROPOFOL 10 MG/ML IV BOLUS
INTRAVENOUS | Status: DC | PRN
  Administered 2018-03-15: 150 mg via INTRAVENOUS
  Administered 2018-03-15: 50 mg via INTRAVENOUS

## 2018-03-15 MED ORDER — KETAMINE HCL 10 MG/ML IJ SOLN
INTRAMUSCULAR | Status: DC | PRN
  Administered 2018-03-15: 30 mg via INTRAVENOUS
  Administered 2018-03-15: 20 mg via INTRAVENOUS

## 2018-03-15 MED ORDER — ONDANSETRON 8 MG PO TBDP: 8.0000 mg | ORAL_TABLET | Freq: Three times a day (TID) | ORAL | 0 refills | Status: AC | PRN

## 2018-03-15 MED ORDER — DEXAMETHASONE SODIUM PHOSPHATE 10 MG/ML IJ SOLN
INTRAMUSCULAR | Status: AC
Start: 2018-03-15 — End: ?
  Filled 2018-03-15: qty 1

## 2018-03-15 MED ORDER — ROCURONIUM BROMIDE 100 MG/10ML IV SOLN
INTRAVENOUS | Status: DC | PRN
  Administered 2018-03-15: 50 mg via INTRAVENOUS
  Administered 2018-03-15: 20 mg via INTRAVENOUS

## 2018-03-15 MED ORDER — OXYCODONE-ACETAMINOPHEN 5-325 MG PO TABS: 1.0000 | ORAL_TABLET | ORAL | 0 refills | Status: AC | PRN

## 2018-03-15 MED ORDER — LIDOCAINE HCL (PF) 2 % IJ SOLN
INTRAMUSCULAR | Status: AC
Start: 2018-03-15 — End: ?
  Filled 2018-03-15: qty 10

## 2018-03-15 MED ORDER — OXYCODONE-ACETAMINOPHEN 5-325 MG PO TABS
1.0000 | ORAL_TABLET | ORAL | Status: DC | PRN
  Administered 2018-03-15: 1 via ORAL

## 2018-03-15 MED ORDER — LACTATED RINGERS IV SOLN
INTRAVENOUS | Status: DC
  Administered 2018-03-15 (×2): via INTRAVENOUS

## 2018-03-15 MED ORDER — HYDROMORPHONE HCL 1 MG/ML IJ SOLN
INTRAMUSCULAR | Status: DC | PRN
  Administered 2018-03-15: 1 mg via INTRAVENOUS

## 2018-03-15 MED ORDER — PHENYLEPHRINE HCL 10 MG/ML IJ SOLN
INTRAMUSCULAR | Status: DC | PRN
  Administered 2018-03-15: 100 ug via INTRAVENOUS

## 2018-03-15 MED ORDER — PROPOFOL 500 MG/50ML IV EMUL
INTRAVENOUS | Status: AC
  Filled 2018-03-15: qty 50

## 2018-03-15 MED ORDER — FAMOTIDINE 20 MG PO TABS
ORAL_TABLET | ORAL | Status: AC
  Filled 2018-03-15: qty 1

## 2018-03-15 MED ORDER — ROCURONIUM BROMIDE 50 MG/5ML IV SOLN
INTRAVENOUS | Status: AC
  Filled 2018-03-15: qty 2

## 2018-03-15 MED ORDER — ACETAMINOPHEN NICU IV SYRINGE 10 MG/ML
INTRAVENOUS | Status: AC
  Filled 2018-03-15: qty 1

## 2018-03-15 MED ORDER — OXYCODONE-ACETAMINOPHEN 5-325 MG PO TABS
ORAL_TABLET | ORAL | Status: AC
  Filled 2018-03-15: qty 1

## 2018-03-15 MED ORDER — MIDAZOLAM HCL 5 MG/5ML IJ SOLN
INTRAMUSCULAR | Status: AC
  Filled 2018-03-15: qty 5

## 2018-03-15 MED ORDER — ONDANSETRON HCL 4 MG/2ML IJ SOLN: 4.0000 mg | Freq: Once | INTRAMUSCULAR | Status: DC | PRN

## 2018-03-15 MED ORDER — FENTANYL CITRATE (PF) 250 MCG/5ML IJ SOLN
INTRAMUSCULAR | Status: AC
  Filled 2018-03-15: qty 5

## 2018-03-15 MED ORDER — FAMOTIDINE 20 MG PO TABS: 20.0000 mg | ORAL_TABLET | Freq: Once | ORAL | Status: DC

## 2018-03-15 MED ORDER — SCOPOLAMINE 1 MG/3DAYS TD PT72
1.0000 | MEDICATED_PATCH | Freq: Once | TRANSDERMAL | Status: DC
  Administered 2018-03-15: 1.5 mg via TRANSDERMAL

## 2018-03-15 MED ORDER — HYDROMORPHONE HCL 1 MG/ML IJ SOLN
INTRAMUSCULAR | Status: AC
  Filled 2018-03-15: qty 1

## 2018-03-15 MED ORDER — ONDANSETRON HCL 4 MG/2ML IJ SOLN
INTRAMUSCULAR | Status: DC | PRN
  Administered 2018-03-15 (×2): 4 mg via INTRAVENOUS

## 2018-03-15 MED ORDER — EPHEDRINE SULFATE 50 MG/ML IJ SOLN
INTRAMUSCULAR | Status: DC | PRN
  Administered 2018-03-15: 10 mg via INTRAVENOUS

## 2018-03-15 MED ORDER — KETAMINE HCL 50 MG/ML IJ SOLN
INTRAMUSCULAR | Status: AC
  Filled 2018-03-15: qty 10

## 2018-03-15 MED ORDER — DEXAMETHASONE SODIUM PHOSPHATE 10 MG/ML IJ SOLN
INTRAMUSCULAR | Status: DC | PRN
  Administered 2018-03-15: 10 mg via INTRAVENOUS

## 2018-03-15 MED ORDER — GLYCOPYRROLATE 0.2 MG/ML IJ SOLN
INTRAMUSCULAR | Status: DC | PRN
  Administered 2018-03-15: 0.2 mg via INTRAVENOUS

## 2018-03-15 MED ORDER — FENTANYL CITRATE (PF) 100 MCG/2ML IJ SOLN
INTRAMUSCULAR | Status: AC
  Administered 2018-03-15: 25 ug via INTRAVENOUS
  Filled 2018-03-15: qty 2

## 2018-03-15 MED ORDER — ONDANSETRON HCL 4 MG/2ML IJ SOLN
INTRAMUSCULAR | Status: AC
  Filled 2018-03-15: qty 4

## 2018-03-15 MED ORDER — PROPOFOL 500 MG/50ML IV EMUL
INTRAVENOUS | Status: DC | PRN
  Administered 2018-03-15: 125 ug/kg/min via INTRAVENOUS
  Administered 2018-03-15: 11:00:00 via INTRAVENOUS

## 2018-03-15 MED ORDER — KETOROLAC TROMETHAMINE 30 MG/ML IJ SOLN
INTRAMUSCULAR | Status: DC | PRN
  Administered 2018-03-15: 30 mg via INTRAVENOUS

## 2018-03-15 MED ORDER — CEFAZOLIN SODIUM-DEXTROSE 2-4 GM/100ML-% IV SOLN
INTRAVENOUS | Status: AC
  Filled 2018-03-15: qty 100

## 2018-03-15 MED ORDER — ACETAMINOPHEN 10 MG/ML IV SOLN
INTRAVENOUS | Status: DC | PRN
  Administered 2018-03-15: 1000 mg via INTRAVENOUS

## 2018-03-15 MED ORDER — SUGAMMADEX SODIUM 200 MG/2ML IV SOLN
INTRAVENOUS | Status: AC
  Filled 2018-03-15: qty 2

## 2018-03-15 MED ORDER — BUPIVACAINE HCL (PF) 0.5 % IJ SOLN
INTRAMUSCULAR | Status: AC
  Filled 2018-03-15: qty 30

## 2018-03-15 MED ORDER — IBUPROFEN 600 MG PO TABS: 600.0000 mg | ORAL_TABLET | Freq: Four times a day (QID) | ORAL | 0 refills | Status: AC

## 2018-03-15 MED ORDER — BUPIVACAINE HCL 0.5 % IJ SOLN
INTRAMUSCULAR | Status: DC | PRN
  Administered 2018-03-15: 14 mL

## 2018-03-15 MED ORDER — SCOPOLAMINE 1 MG/3DAYS TD PT72
MEDICATED_PATCH | TRANSDERMAL | Status: AC
  Filled 2018-03-15: qty 1

## 2018-03-15 SURGICAL SUPPLY — 59 items
BAG URINE DRAINAGE (UROLOGICAL SUPPLIES) ×4 IMPLANT
BLADE SURG SZ11 CARB STEEL (BLADE) ×4 IMPLANT
CATH FOLEY 2WAY  5CC 16FR (CATHETERS) ×2
CATH ROBINSON RED A/P 16FR (CATHETERS) ×4 IMPLANT
CATH URTH 16FR FL 2W BLN LF (CATHETERS) ×2 IMPLANT
CHLORAPREP W/TINT 26ML (MISCELLANEOUS) ×4 IMPLANT
DEFOGGER SCOPE WARMER CLEARIFY (MISCELLANEOUS) ×4 IMPLANT
DERMABOND ADVANCED (GAUZE/BANDAGES/DRESSINGS) ×2
DERMABOND ADVANCED .7 DNX12 (GAUZE/BANDAGES/DRESSINGS) ×2 IMPLANT
DEVICE SUTURE ENDOST 10MM (ENDOMECHANICALS) ×4 IMPLANT
DRAPE LEGGINS SURG 28X43 STRL (DRAPES) ×4 IMPLANT
DRAPE SHEET LG 3/4 BI-LAMINATE (DRAPES) ×4 IMPLANT
DRAPE UNDER BUTTOCK W/FLU (DRAPES) ×4 IMPLANT
GLOVE BIO SURGEON STRL SZ7 (GLOVE) ×12 IMPLANT
GLOVE BIOGEL PI IND STRL 7.5 (GLOVE) ×2 IMPLANT
GLOVE BIOGEL PI INDICATOR 7.5 (GLOVE) ×2
GLOVE INDICATOR 7.5 STRL GRN (GLOVE) ×12 IMPLANT
GOWN STRL REUS W/ TWL LRG LVL3 (GOWN DISPOSABLE) ×6 IMPLANT
GOWN STRL REUS W/ TWL XL LVL3 (GOWN DISPOSABLE) ×2 IMPLANT
GOWN STRL REUS W/TWL LRG LVL3 (GOWN DISPOSABLE) ×6
GOWN STRL REUS W/TWL XL LVL3 (GOWN DISPOSABLE) ×2
GRASPER SUT TROCAR 14GX15 (MISCELLANEOUS) ×4 IMPLANT
IRRIGATION STRYKERFLOW (MISCELLANEOUS) ×2 IMPLANT
IRRIGATOR STRYKERFLOW (MISCELLANEOUS) ×4
IV LACTATED RINGERS 1000ML (IV SOLUTION) ×12 IMPLANT
KIT PINK PAD W/HEAD ARE REST (MISCELLANEOUS) ×4
KIT PINK PAD W/HEAD ARM REST (MISCELLANEOUS) ×2 IMPLANT
KIT TURNOVER CYSTO (KITS) ×4 IMPLANT
LABEL OR SOLS (LABEL) ×4 IMPLANT
LIGASURE VESSEL 5MM BLUNT TIP (ELECTROSURGICAL) ×8 IMPLANT
MANIPULATOR VCARE LG CRV RETR (MISCELLANEOUS) ×4 IMPLANT
MANIPULATOR VCARE SML CRV RETR (MISCELLANEOUS) IMPLANT
MANIPULATOR VCARE STD CRV RETR (MISCELLANEOUS) IMPLANT
NEEDLE HYPO 22GX1.5 SAFETY (NEEDLE) ×4 IMPLANT
OCCLUDER COLPOPNEUMO (BALLOONS) ×4 IMPLANT
PACK LAP CHOLECYSTECTOMY (MISCELLANEOUS) ×4 IMPLANT
PAD OB MATERNITY 4.3X12.25 (PERSONAL CARE ITEMS) ×4 IMPLANT
PAD PREP 24X41 OB/GYN DISP (PERSONAL CARE ITEMS) ×4 IMPLANT
PORT ACCESS TROCAR AIRSEAL 12 (TROCAR) ×2 IMPLANT
PORT ACCESS TROCAR AIRSEAL 5M (TROCAR) ×2
SCISSORS METZENBAUM CVD 33 (INSTRUMENTS) ×4 IMPLANT
SET CYSTO W/LG BORE CLAMP LF (SET/KITS/TRAYS/PACK) ×4 IMPLANT
SET TRI-LUMEN FLTR TB AIRSEAL (TUBING) ×4 IMPLANT
SLEEVE ENDOPATH XCEL 5M (ENDOMECHANICALS) ×4 IMPLANT
SOL PREP PVP 2OZ (MISCELLANEOUS) ×4
SOLUTION ELECTROLUBE (MISCELLANEOUS) ×4 IMPLANT
SOLUTION PREP PVP 2OZ (MISCELLANEOUS) ×2 IMPLANT
SPONGE XRAY 4X4 16PLY STRL (MISCELLANEOUS) ×4 IMPLANT
SURGILUBE 2OZ TUBE FLIPTOP (MISCELLANEOUS) ×4 IMPLANT
SUT ENDO VLOC 180-0-8IN (SUTURE) ×4 IMPLANT
SUT MNCRL 4-0 (SUTURE) ×2
SUT MNCRL 4-0 27XMFL (SUTURE) ×2
SUT VIC AB 0 CT1 36 (SUTURE) ×4 IMPLANT
SUTURE MNCRL 4-0 27XMF (SUTURE) ×2 IMPLANT
SYR 10ML LL (SYRINGE) ×8 IMPLANT
SYR 50ML LL SCALE MARK (SYRINGE) ×4 IMPLANT
TROCAR ENDO BLADELESS 11MM (ENDOMECHANICALS) IMPLANT
TROCAR XCEL NON-BLD 5MMX100MML (ENDOMECHANICALS) ×4 IMPLANT
TUBING INSUF HEATED (TUBING) ×4 IMPLANT

## 2018-03-15 NOTE — Interval H&P Note (Signed)
History and Physical Interval Note:  03/15/2018 8:56 AM  Andrea Decker  has presented today for surgery, with the diagnosis of ADENOMYOSIS,MENORRHAGIA  The various methods of treatment have been discussed with the patient and family. After consideration of risks, benefits and other options for treatment, the patient has consented to  Procedure(s): HYSTERECTOMY TOTAL LAPAROSCOPIC BILATERAL SALPINGECTOMY (Bilateral) CYSTOSCOPY (N/A) as a surgical intervention .  The patient's history has been reviewed, patient examined, no change in status, stable for surgery.  I have reviewed the patient's chart and labs.  Questions were answered to the patient's satisfaction.    Prentice Docker, MD, [facility], [facility] 03/15/2018 8:56 AM

## 2018-03-15 NOTE — Anesthesia Preprocedure Evaluation (Signed)
Anesthesia Evaluation  Patient identified by MRN, date of birth, ID band Patient awake    Reviewed: Allergy & Precautions, H&P , NPO status , Patient's Chart, lab work & pertinent test results, reviewed documented beta blocker date and time   History of Anesthesia Complications (+) PONV and history of anesthetic complications  Airway Mallampati: II  TM Distance: >3 FB Neck ROM: full    Dental  (+) Teeth Intact   Pulmonary neg pulmonary ROS, former smoker,    Pulmonary exam normal        Cardiovascular Exercise Tolerance: Good negative cardio ROS Normal cardiovascular exam+ dysrhythmias  Rhythm:regular Rate:Normal     Neuro/Psych negative neurological ROS  negative psych ROS   GI/Hepatic negative GI ROS, Neg liver ROS,   Endo/Other  negative endocrine ROSHypothyroidism   Renal/GU negative Renal ROS  negative genitourinary   Musculoskeletal   Abdominal   Peds  Hematology negative hematology ROS (+)   Anesthesia Other Findings Past Medical History: No date: Adenomyosis No date: Dysrhythmia     Comment:  hx of svt, evaluated by md but nothing ongoing now No date: Goiter No date: Hypothyroidism No date: Mitral valve prolapse No date: PONV (postoperative nausea and vomiting)     Comment:  nauseated Past Surgical History: 2014: THYROIDECTOMY No date: WISDOM TOOTH EXTRACTION BMI    Body Mass Index:  25.69 kg/m     Reproductive/Obstetrics negative OB ROS                             Anesthesia Physical Anesthesia Plan  ASA: II  Anesthesia Plan: General ETT   Post-op Pain Management:    Induction:   PONV Risk Score and Plan: 4 or greater  Airway Management Planned:   Additional Equipment:   Intra-op Plan:   Post-operative Plan:   Informed Consent: I have reviewed the patients History and Physical, chart, labs and discussed the procedure including the risks, benefits and  alternatives for the proposed anesthesia with the patient or authorized representative who has indicated his/her understanding and acceptance.   Dental Advisory Given  Plan Discussed with: CRNA  Anesthesia Plan Comments:         Anesthesia Quick Evaluation

## 2018-03-15 NOTE — Discharge Instructions (Signed)
AMBULATORY SURGERY  °DISCHARGE INSTRUCTIONS ° ° °1) The drugs that you were given will stay in your system until tomorrow so for the next 24 hours you should not: ° °A) Drive an automobile °B) Make any legal decisions °C) Drink any alcoholic beverage ° ° °2) You may resume regular meals tomorrow.  Today it is better to start with liquids and gradually work up to solid foods. ° °You may eat anything you prefer, but it is better to start with liquids, then soup and crackers, and gradually work up to solid foods. ° ° °3) Please notify your doctor immediately if you have any unusual bleeding, trouble breathing, redness and pain at the surgery site, drainage, fever, or pain not relieved by medication. ° ° ° °4) Additional Instructions: ° ° ° ° ° ° ° °Please contact your physician with any problems or Same Day Surgery at [phone], Monday through Friday 6 am to 4 pm, or [facility] Main number at [phone]. °

## 2018-03-15 NOTE — Transfer of Care (Signed)
Immediate Anesthesia Transfer of Care Note  Patient: Andrea Decker  Procedure(s) Performed: HYSTERECTOMY TOTAL LAPAROSCOPIC BILATERAL SALPINGECTOMY (Bilateral ) CYSTOSCOPY (N/A )  Patient Location: PACU  Anesthesia Type:General  Level of Consciousness: drowsy and patient cooperative  Airway & Oxygen Therapy: Patient Spontanous Breathing and Patient connected to face mask oxygen  Post-op Assessment: Report given to RN, Post -op Vital signs reviewed and stable and Patient moving all extremities  Post vital signs: Reviewed and stable  Last Vitals:  Vitals Value Taken Time  BP 114/73 03/15/2018 11:30 AM  Temp 36.6 C 03/15/2018 11:28 AM  Pulse 64 03/15/2018 11:30 AM  Resp 15 03/15/2018 11:30 AM  SpO2 100 % 03/15/2018 11:30 AM  Vitals shown include unvalidated device data.  Last Pain:  Vitals:   03/15/18 0820  TempSrc: Oral         Complications: No apparent anesthesia complications

## 2018-03-15 NOTE — Anesthesia Post-op Follow-up Note (Signed)
Anesthesia QCDR form completed.        

## 2018-03-15 NOTE — OR Nursing (Signed)
Discharge instructions discussed with pt and husband. Both voice understanding. 

## 2018-03-15 NOTE — Op Note (Addendum)
Operative Note    Pre-Operative Diagnosis:  1) menorrhagia with irregular cycle 2) Adenomyosis  Post-OperativeDiagnosis:  1) menorrhagia with irregular cycle 2) Adenomyosis  Procedures:  1) Total Laparoscopic hysterectomy, bilateral salpingectomy 2) Cystoscopy 3) Removal of Mirena intrauterine device   Primary Surgeon: Prentice Docker, MD   Assistant Surgeon: Dr. Adrian Prows; No other capable assistant available, in surgery requiring high level assistant.  EBL: 100 mL   IVF: 1,500 mL crystalloid  Urine output: 500 mL clear urine at end of case  Specimens: uterus with cervix and bilateral fallopian tubes  Drains: none  Complications: None   Disposition: PACU   Condition: Stable   Findings:  1) normal appearing uterus, fallopian tubes (with paratubal cysts), and ovaries 2) no evidence of bladder damage on cystoscopy with bilateral efflux of urine from the ureteral orifices  Procedure Summary:  The patient was taken to the operating room where general anesthesia was administered and found to be adequate. She was placed in the dorsal supine lithotomy position in Dupont stirrups and prepped and draped in usual sterile fashion. After a timeout was called, an indwelling catheter was placed in her bladder. A sterile speculum was placed in the vagina.  The Mirena IUD was removed intact using a ring forcep by grasping the string and applying gentle traction.  A single-tooth tenaculum was used to grasp the anterior lip of the cervix. A V-Care uterine manipulator was affixed to the uterus in accordance to the manufacturers recommendations. The speculum and tenaculum were removed from the vagina.  Attention was turned to the abdomen where, after injection of local anesthetic, a 5 mm infraumbilical incision was made with the scalpel. Entry into the abdomen was obtained via Optiview trocar technique (a blunt entry technique with camera visualization through the obturator upon  entry). Verification of entry into the abdomen was obtained using opening pressures. The abdomen was insufflated with CO2. The camera was introduced through the trocar with verification of atraumatic entry. A left lower quadrant 5 mm port was created via direct intra-abdominal camera visualization without difficulty. An 11 mm right lower quadrant port was placed in a similar fashion without difficulty.  After inspection of the abdomen and pelvis with the above-noted findings, the bilateral ureters were identified and found to be well away from the operative area of interest. The right fallopian tube was grasped at the fimbriated end and was transected using the LigaSure along the mesosalpinx in a lateral to medial fashion. The LigaSure then was used to transect the right round ligament and the utero-ovarian ligament was transected. Tissue was divided along the right broad ligament to the level of the anterior cervical os. The lower uterine segment was identified. The bladder flap was developed without difficulty.  The right uterine artery was skeletonized and identified and after ligation was transected with the LigaSure device. The same procedure was carried out on the left side. The colpotomy was performed using monopolar electrocautery in a circumferential fashion following the KOH ring.  The uterus and fallopian tubes and cervix were removed through the vagina.  Attention was returned to the pelvis and closure of the vaginal cuff was undertaken using the V-lock stitch in a running fashion. A gloved hand was placed in the vagina to assess adequate closure of the vaginal cuff. This was found to be satisfactory. All vascular pedicles were inspected and found to be hemostatic. Copious irrigation was undertaken and hemostasis was again verified. The right lower quadrant trocar was removed and the fascia was  reapproximated using #0 Vicryl with a single stitch. The abdomen was then desufflated of CO2 after removal  of all instruments. Five deep breaths were given by anesthesia to the patient to help with removal of CO2 from the abdomen. The right lower quadrant skin incision was closed using 4-0 Vicryl in a subcuticular fashion. The remaining skin incisions were closed using surgical skin glue and a layer of surgical skin glue was placed over the right lower quadrant skin incision, as well.   Cystoscopy was undertaken at this point. The Foley catheter was removed and the 70 cystoscope was gently introduced through the urethra. The bladder survey was undertaken with efflux of urine from both orifices noted. There were no defects noted in the bladder wall. The cystoscope was removed and the Foley catheter was replaced only to drain the cystoscopy fluid. The foley was then removed.  The vagina was inspected and found to be free of instrumentation and sponges.  The patient tolerated the procedure well.  Sponge, lap, needle, and instrument counts were correct x 2.  VTE prophylaxis: SCDs. Antibiotic prophylaxis: Ancef 2 grams IV within 30 minutes prior to start of procedure. She was awakened in the operating room and was taken to the PACU in stable condition.   Prentice Docker, MD 03/15/2018 11:18 AM

## 2018-03-15 NOTE — Anesthesia Procedure Notes (Signed)
Procedure Name: Intubation Date/Time: 03/15/2018 9:19 AM Performed by: Nile Riggs, CRNA Pre-anesthesia Checklist: Patient identified, Emergency Drugs available, Suction available, Patient being monitored and Timeout performed Patient Re-evaluated:Patient Re-evaluated prior to induction Oxygen Delivery Method: Circle system utilized Preoxygenation: Pre-oxygenation with 100% oxygen Induction Type: IV induction Ventilation: Mask ventilation without difficulty Laryngoscope Size: Miller and 2 Grade View: Grade I Tube type: Oral Tube size: 7.5 mm Number of attempts: 1 Airway Equipment and Method: Stylet Placement Confirmation: ETT inserted through vocal cords under direct vision,  positive ETCO2,  CO2 detector and breath sounds checked- equal and bilateral Secured at: 21 cm Tube secured with: Tape Dental Injury: Teeth and Oropharynx as per pre-operative assessment

## 2018-03-16 NOTE — Anesthesia Postprocedure Evaluation (Signed)
Anesthesia Post Note  Patient: Andrea Decker  Procedure(s) Performed: HYSTERECTOMY TOTAL LAPAROSCOPIC BILATERAL SALPINGECTOMY (Bilateral ) CYSTOSCOPY (N/A )  Patient location during evaluation: PACU Anesthesia Type: General Level of consciousness: awake and alert Pain management: pain level controlled Vital Signs Assessment: post-procedure vital signs reviewed and stable Respiratory status: spontaneous breathing, nonlabored ventilation, respiratory function stable and patient connected to nasal cannula oxygen Cardiovascular status: blood pressure returned to baseline and stable Postop Assessment: no apparent nausea or vomiting Anesthetic complications: no     Last Vitals:  Vitals:   03/15/18 1245 03/15/18 1300  BP: 124/79 110/81  Pulse: 70 62  Resp: 16 16  Temp: (!) 36.2 C   SpO2: 100% 100%    Last Pain:  Vitals:   03/16/18 1003  TempSrc:   PainSc: 3                  Molli Barrows

## 2018-03-17 LAB — SURGICAL PATHOLOGY

## 2018-03-21 ENCOUNTER — Ambulatory Visit (INDEPENDENT_AMBULATORY_CARE_PROVIDER_SITE_OTHER): Payer: BC Managed Care – PPO | Admitting: Obstetrics and Gynecology

## 2018-03-21 ENCOUNTER — Encounter: Payer: Self-pay | Admitting: Obstetrics and Gynecology

## 2018-03-21 VITALS — BP 112/74 | HR 70 | Ht 67.0 in | Wt 167.0 lb

## 2018-03-21 DIAGNOSIS — Z09 Encounter for follow-up examination after completed treatment for conditions other than malignant neoplasm: Secondary | ICD-10-CM

## 2018-03-21 DIAGNOSIS — Z9889 Other specified postprocedural states: Secondary | ICD-10-CM

## 2018-03-21 NOTE — Progress Notes (Signed)
   Postoperative Follow-up Patient presents post op from TLH/BS/Cysto 1 week ago for abnormal uterine bleeding.  Subjective: Patient reports marked improvement in her preop symptoms. Eating a regular diet without difficulty. She is having some pain on her right lower quadrant incision, especially while standing. The pain is absent while sitting. The pain level is improving daily.  Activity: increasing activities slowly, but appropriately.  Objective: Vitals:   03/21/18 1139  BP: 112/74  Pulse: 70  SpO2: 99%   Vital Signs: BP 112/74 (BP Location: Left Arm, Patient Position: Sitting, Cuff Size: Normal)   Pulse 70   Ht 5\' 7"  (1.702 m)   Wt 167 lb (75.8 kg)   LMP 03/02/2018 (Approximate)   SpO2 99%   BMI 26.16 kg/m  Constitutional: Well nourished, well developed female in no acute distress.  HEENT: normal Skin: Warm and dry.  Extremity: no edema  Abdomen: Soft, non-tender, normal bowel sounds; no bruits, organomegaly or masses. clean, dry, intact and no erythema, induration, warmth, or tenderness at any incision site   Assessment: 37 y.o. s/p TLH/BS/Cysto progressing well  Plan: Patient has done well after surgery with no apparent complications.  I have discussed the post-operative course to date, and the expected progress moving forward.  The patient understands what complications to be concerned about.  I will see the patient in routine follow up, or sooner if needed.    Activity plan: increase activity slowly Incision care instructions reviewed.  Pathology report reviewed.   Return in about 5 weeks (around 04/25/2018) for Six week post op visit.   Prentice Docker, MD 03/21/2018, 11:42 AM

## 2018-04-01 ENCOUNTER — Encounter: Payer: Self-pay | Admitting: Obstetrics and Gynecology

## 2018-04-25 ENCOUNTER — Encounter: Payer: Self-pay | Admitting: Obstetrics and Gynecology

## 2018-04-25 ENCOUNTER — Ambulatory Visit (INDEPENDENT_AMBULATORY_CARE_PROVIDER_SITE_OTHER): Payer: BC Managed Care – PPO | Admitting: Obstetrics and Gynecology

## 2018-04-25 VITALS — BP 102/62 | HR 67 | Ht 67.0 in | Wt 164.0 lb

## 2018-04-25 DIAGNOSIS — Z09 Encounter for follow-up examination after completed treatment for conditions other than malignant neoplasm: Secondary | ICD-10-CM

## 2018-04-25 DIAGNOSIS — F419 Anxiety disorder, unspecified: Secondary | ICD-10-CM

## 2018-04-25 NOTE — Progress Notes (Signed)
   Postoperative Follow-up Patient presents post op from [facility], bilateral salpingectomy, cystoscopy 6 weeks ago for menorrhagia with irregular cycle, adenomyosis.  Subjective: Patient reports marked improvement in her preop symptoms. Eating a regular diet without difficulty. The patient is not having any pain.  Activity: normal activities of daily living. She occasionally has a small tinge of pain in her RLQ incision site.  She also notes some pinkish-red discharge vaginally. Denies frank red blood and passage of clots.  She also notes a large amount of anxiety since her surgery. More than the mental symptoms, she states that she has physical manifestations of her anxiety to the point where she will begin shaking. She wonder whether stopping the IUD and oral contraception abruptly have affected her. She states that when she has tried the nuvaring in the past she became suicidal, to the point where she had a plan. Once the NuvaRing was removed, her symptoms went away. She declines any treatment at this time as she states that things are improving.  Objective: Vitals:   04/25/18 1013  BP: 102/62  Pulse: 67  SpO2: 99%   Vital Signs: BP 102/62   Pulse 67   Ht 5\' 7"  (1.702 m)   Wt 164 lb (74.4 kg)   SpO2 99%   BMI 25.69 kg/m  Constitutional: Well nourished, well developed female in no acute distress.  HEENT: normal Skin: Warm and dry.  Extremity: no edema  Abdomen: Soft, non-tender, normal bowel sounds; no bruits, organomegaly or masses. clean, dry, intact and no erythema, induration, warmth, and tenderness at any incision site  Pelvic exam: (female chaperone present) is not limited by body habitus EGBUS: within normal limits Vagina: within normal limits and with scant pink discharge in the vault. Vaginal cuff is intact without induration, mild tenderness. Also, noted is a small area of granulation tissue, ~2-39mm in size.  This is treated with silver nitrate and there is no active  bleeding.  The suture line is intact.     Assessment: 37 y.o. s/p TLH/BS/Cystoscopy progressing well  Plan: Patient has done well after surgery with no apparent complications.  I have discussed the post-operative course to date, and the expected progress moving forward.  The patient understands what complications to be concerned about.  I will see the patient in routine follow up, or sooner if needed.    Activity plan: two more weeks of no heavy lifting. Paperwork for her job filled out today.  Restrict sexual activity for 2 more weeks.  Anxiety: will continue to monitor. Will check her thyroid hormone and her FSH, LH, and estradiol levels today just to make sure she has no abnormalities, as she reports being very sensitive to fluctuations in these hormones.   Follow up as needed.  She can contact me via MyChart if problems arise. She should return to clinic if she notes more pink or red discharge and/or bleeding or worsening pelvic pressure.  Prentice Docker, MD 04/25/2018, 10:20 AM

## 2018-04-26 ENCOUNTER — Telehealth: Payer: Self-pay

## 2018-04-26 LAB — FOLLICLE STIMULATING HORMONE: FSH: 16.2 m[IU]/mL

## 2018-04-26 LAB — ESTRADIOL: Estradiol: 161.3 pg/mL

## 2018-04-26 LAB — TSH+FREE T4
Free T4: 2.13 ng/dL — ABNORMAL HIGH (ref 0.82–1.77)
TSH: 0.146 u[IU]/mL — ABNORMAL LOW (ref 0.450–4.500)

## 2018-04-26 LAB — PROGESTERONE: Progesterone: 1 ng/mL

## 2018-04-26 LAB — LUTEINIZING HORMONE: LH: 61.1 m[IU]/mL

## 2018-04-26 NOTE — Telephone Encounter (Signed)
Patient woke up with abdominal pain and was nearly doubled over with pain. Has gotten better gradually throughout the day.  She denies fevers and chills. She also has had some mild red bleeding vaginally (red mixed with discharge).   Patient advised to come back to clinic tomorrow. She states, however, that she is unable to do so due to her job.  She will try to come in next week for assessment, if bleeding continues. I asked her to make an appointment with one of my partners if her pain continues, as that will need to be investigated.  If she starts passing clots and her pain get significantly worse, she was instructed to go to the ER.

## 2018-04-26 NOTE — Telephone Encounter (Signed)
Pt states she saw SDJ yesterday for a hysterectomy f/u. He used Silver Nitrate to stop some bleeding. She was told not to expect any more after this. She woke up this morning with a whole lot more pain than she has been having & is still bleeding. Pt inquiring if normal. [phone]

## 2018-05-10 ENCOUNTER — Ambulatory Visit: Payer: BC Managed Care – PPO | Admitting: Obstetrics and Gynecology

## 2018-05-16 ENCOUNTER — Encounter: Payer: Self-pay | Admitting: Obstetrics and Gynecology

## 2018-05-16 ENCOUNTER — Ambulatory Visit (INDEPENDENT_AMBULATORY_CARE_PROVIDER_SITE_OTHER): Payer: BC Managed Care – PPO | Admitting: Obstetrics and Gynecology

## 2018-05-16 VITALS — BP 124/74 | Ht 67.0 in | Wt 166.0 lb

## 2018-05-16 DIAGNOSIS — Z09 Encounter for follow-up examination after completed treatment for conditions other than malignant neoplasm: Secondary | ICD-10-CM

## 2018-05-16 NOTE — Progress Notes (Signed)
   Postoperative Follow-up Patient presents post op from [facility], bilateral salpingectomy, cystoscopy 10 weeks ago for menorrhagia with irregular cycle, adenomyosis.  Subjective: Patient reports marked improvement in her preop symptoms. Eating a regular diet without difficulty. The patient is not having any pain.  Activity: normal activities of daily living. Incision sites doing well.  She also notes some pinkish-red discharge vaginally.  This has improved overall from last time. However, she has had intermittent spotting, which will stop and restart. She notes some discomfort and pressure.  Normally the discomfort and pressure are present irrespective of bleeding. However, she stopped bleeding a few days ago and has had no pain or pressure since that time.   Denies frank red blood and passage of clots.  Anxiety has improved since the downward adjustment of her thyroid medication.   Objective: Vitals:   05/16/18 0902  BP: 124/74   Vital Signs: BP 124/74   Ht 5\' 7"  (1.702 m)   Wt 166 lb (75.3 kg)   BMI 26.00 kg/m  Constitutional: Well nourished, well developed female in no acute distress.  HEENT: normal Skin: Warm and dry.  Extremity: no edema  Abdomen: Soft, non-tender, normal bowel sounds; no bruits, organomegaly or masses. clean, dry, intact and no erythema, induration, warmth, and tenderness at any incision site  Pelvic exam: (female chaperone present) is not limited by body habitus EGBUS: within normal limits Vagina: within normal limits and with scant pink discharge in the vault. Vaginal cuff is intact without induration, mild tenderness. Also, noted is a small area of granulation tissue, ~63mm in size.  This is treated with silver nitrate and there is no active bleeding.  The suture line is intact.     Assessment: 37 y.o. s/p TLH/BS/Cystoscopy progressing well  Plan: Patient has done well after surgery with no apparent complications.  I have discussed the post-operative course  to date, and the expected progress moving forward.  The patient understands what complications to be concerned about.  I will see the patient in routine follow up, or sooner if needed.    Activity plan: two more weeks of no heavy lifting. Paperwork for her job filled out today.    Follow up as needed.  She can contact me via MyChart if problems arise. She should return to clinic if she notes more pink or red discharge and/or bleeding or worsening pelvic pressure.  Prentice Docker, MD 05/16/2018, 9:33 AM

## 2018-06-20 ENCOUNTER — Ambulatory Visit (INDEPENDENT_AMBULATORY_CARE_PROVIDER_SITE_OTHER): Payer: BC Managed Care – PPO | Admitting: Obstetrics and Gynecology

## 2018-06-20 ENCOUNTER — Encounter: Payer: Self-pay | Admitting: Obstetrics and Gynecology

## 2018-06-20 ENCOUNTER — Encounter

## 2018-06-20 ENCOUNTER — Ambulatory Visit: Payer: BC Managed Care – PPO | Admitting: Obstetrics and Gynecology

## 2018-06-20 VITALS — BP 116/74 | Ht 67.0 in | Wt 170.0 lb

## 2018-06-20 DIAGNOSIS — Z09 Encounter for follow-up examination after completed treatment for conditions other than malignant neoplasm: Secondary | ICD-10-CM

## 2018-06-20 DIAGNOSIS — N938 Other specified abnormal uterine and vaginal bleeding: Secondary | ICD-10-CM | POA: Diagnosis not present

## 2018-06-20 NOTE — Progress Notes (Signed)
   Postoperative Follow-up Patient presents post op from TLH/BS/Cysto 12 weeks ago for abnormal uterine bleeding.  Subjective: Overall, she has been doing very well.  For the most part she has had no symptoms.  However on Friday, she had vaginal spotting that was unprovoked.  She had associated severe lower abdominal cramping, which she described as being like menstrual cramps.  She was doubled over in pain and had to take Tylenol and ibuprofen to help with her pain.  Her pain is not present today and she is having no spotting today.  At her last visit about 1 month ago she was noted to have a small amount of granulation tissue which was treated with silver nitrate.  Objective: Vitals:   06/20/18 1314  BP: 116/74   Vital Signs: BP 116/74   Ht 5\' 7"  (1.702 m)   Wt 170 lb (77.1 kg)   BMI 26.63 kg/m  Constitutional: Well nourished, well developed female in no acute distress.  HEENT: normal Skin: Warm and dry.  Extremity: no edema  Abdomen: Soft, non-tender, normal bowel sounds; no bruits, organomegaly or masses.  Pelvic exam: (female chaperone present) NEFG, vagina pink and normally rugated.   At the vaginal cuff, just to the right of the midline, there is a ~65mm piece of granulation tissue. This is treated with silver nitrate x 2.  Bimanual is normal and there is no fullness and only mild tenderness to deep palpation at the site of the granulation tissue.  No masses noted on pelvic exam.    Assessment: 37 y.o. s/p TLH/BS/Cysto still with some granulation tissue noted. Treated today with silver nitrate.   Plan: Her bleeding is the result most likely of her granulation tissue, which was treated today.  She had a treatment about 1 month ago and has had very little bleeding since that time.  However, there does appear to be some granulation tissue that remains and it was treated today.  I cannot explain the severe cramping she experienced.  She denies any urinary or GI symptoms.  She denies  any musculoskeletal symptoms, otherwise.  I reassured her regarding her granulation tissue.  I will have her follow-up in 2 weeks to reassess and treat any remaining granulation tissue until it is completely resolved.  Her pain does seem to be associated with her bleeding.  Therefore, as long as I treat her bleeding issue, which is granulation tissue, I believe we should expect good relief from pain.  If her pain continues and does not seem to be related to bleeding, I will have to look for an alternative explanation.  However at this time there appears to be no correlating symptoms to point to another etiology.  Return in about 2 weeks (around 07/04/2018) for post op follow up Dr. Glennon Mac (may double-book).   Prentice Docker, MD 06/20/2018, 1:18 PM

## 2018-07-05 ENCOUNTER — Ambulatory Visit (INDEPENDENT_AMBULATORY_CARE_PROVIDER_SITE_OTHER): Payer: BC Managed Care – PPO | Admitting: Obstetrics and Gynecology

## 2018-07-05 VITALS — BP 118/74 | Ht 67.0 in | Wt 168.0 lb

## 2018-07-05 DIAGNOSIS — L929 Granulomatous disorder of the skin and subcutaneous tissue, unspecified: Secondary | ICD-10-CM

## 2018-07-05 DIAGNOSIS — Z09 Encounter for follow-up examination after completed treatment for conditions other than malignant neoplasm: Secondary | ICD-10-CM

## 2018-07-05 NOTE — Progress Notes (Signed)
   Postoperative Follow-up Patient presents post op from TLH/BS/Cysto 14 weeks ago for abnormal uterine bleeding.  Subjective: Overall, she has been doing very well.  For the most part she has had no symptoms.  At her last visit about 1 month ago she was noted to have a small amount of granulation tissue which was treated with silver nitrate.  She has had no bleeding or pain since her last visit until yesterday she began spotting.   Objective: Vitals:   07/05/18 0855  BP: 118/74   Vital Signs: BP 118/74   Ht 5\' 7"  (1.702 m)   Wt 168 lb (76.2 kg)   BMI 26.31 kg/m  Constitutional: Well nourished, well developed female in no acute distress.  HEENT: normal Skin: Warm and dry.  Extremity: no edema  Abdomen: Soft, non-tender, normal bowel sounds; no bruits, organomegaly or masses.  Pelvic exam: (female chaperone present) NEFG, vagina pink and normally rugated.   At the vaginal cuff, just to the right of the midline, there is a ~74mm piece of granulation tissue. This is treated with silver nitrate x 2.  Bimanual is normal and there is no fullness and only mild tenderness to deep palpation at the site of the granulation tissue.  No masses noted on pelvic exam.    Assessment: 37 y.o. s/p TLH/BS/Cysto still with some granulation tissue noted. Treated today with silver nitrate.   Plan: Her bleeding is the result most likely of her granulation tissue, which was treated today.  However, there does appear to be some granulation tissue that remains and it was treated today.  We will continue treating the granulation tissue every 2 weeks or consider trying to remove the granulation tissue using biopsy forceps to speed up the process.  Return in about 2 weeks (around 07/19/2018) for incision check.   Prentice Docker, MD 07/06/2018, 6:26 PM

## 2018-07-06 ENCOUNTER — Encounter: Payer: Self-pay | Admitting: Obstetrics and Gynecology

## 2018-07-18 ENCOUNTER — Ambulatory Visit (INDEPENDENT_AMBULATORY_CARE_PROVIDER_SITE_OTHER): Payer: BC Managed Care – PPO | Admitting: Obstetrics and Gynecology

## 2018-07-18 ENCOUNTER — Encounter: Payer: Self-pay | Admitting: Obstetrics and Gynecology

## 2018-07-18 VITALS — BP 120/68 | Wt 170.0 lb

## 2018-07-18 DIAGNOSIS — Z09 Encounter for follow-up examination after completed treatment for conditions other than malignant neoplasm: Secondary | ICD-10-CM | POA: Diagnosis not present

## 2018-07-18 NOTE — Progress Notes (Signed)
   Postoperative Follow-up Patient presents post op from TLH/BS/Cysto 16 weeks ago for abnormal uterine bleeding.  Subjective: Overall, she has been doing very well.  For the most part she has had no symptoms.  At her last visit about 2 weeks ago she was noted to have a small amount of granulation tissue which was treated with silver nitrate. She has some mild symptoms early last week. She has had nothing in that time.   Objective: Vitals:   07/18/18 0842  BP: 120/68   Vital Signs: BP 120/68 (BP Location: Left Arm, Patient Position: Sitting, Cuff Size: Normal)   Wt 170 lb (77.1 kg)   BMI 26.63 kg/m  Constitutional: Well nourished, well developed female in no acute distress.  HEENT: normal Skin: Warm and dry.  Extremity: no edema  Abdomen: Soft, non-tender, normal bowel sounds; no bruits, organomegaly or masses.  Pelvic exam: (female chaperone present) NEFG, vagina pink and normally rugated.   At the vaginal cuff, just to the right of the midline, there is a very small piece of granulation tissue nearly flush with vaginal cuff. This is treated with silver nitrate x 2.  Bimanual is normal and there is no fullness and only mild tenderness to deep palpation at the site of the granulation tissue.  No masses noted on pelvic exam.    Assessment: 37 y.o. s/p TLH/BS/Cysto still with some granulation tissue noted. Treated today with silver nitrate.   Plan: Her bleeding is the result most likely of her granulation tissue, which was treated today.  However, there does appear to be a minimal amount of granulation tissue that remains and it was treated today.  My plan today was to remove any possible granulation tissue with forceps. However, there was not enough tissue to grasp.  So, only silver nitrate was used. Will have her follow up in 3 weeks. If ok at that time, will stop visits.   Return in about 3 weeks (around 08/08/2018) for incision check.   Prentice Docker, MD 07/18/2018, 9:01 AM

## 2018-08-10 ENCOUNTER — Ambulatory Visit (INDEPENDENT_AMBULATORY_CARE_PROVIDER_SITE_OTHER): Payer: BC Managed Care – PPO | Admitting: Obstetrics and Gynecology

## 2018-08-10 ENCOUNTER — Encounter: Payer: Self-pay | Admitting: Obstetrics and Gynecology

## 2018-08-10 VITALS — BP 122/78 | Ht 67.0 in

## 2018-08-10 DIAGNOSIS — Z09 Encounter for follow-up examination after completed treatment for conditions other than malignant neoplasm: Secondary | ICD-10-CM | POA: Diagnosis not present

## 2018-08-10 DIAGNOSIS — N99820 Postprocedural hemorrhage and hematoma of a genitourinary system organ or structure following a genitourinary system procedure: Secondary | ICD-10-CM | POA: Diagnosis not present

## 2018-08-10 NOTE — Progress Notes (Signed)
   Postoperative Follow-up Patient presents post op from TLH/BS/Cysto 19 weeks ago for abnormal uterine bleeding.  Subjective: Overall, she has been doing very well.  She has had no symptoms.  At her last visit about 3 weeks ago she was noted to have a small amount of granulation tissue which was treated with silver nitrate. She has had symptoms at all in that time.   Objective: Vitals:   08/10/18 0820  BP: 122/78   Vital Signs: BP 122/78   Ht 5\' 7"  (1.702 m)   BMI 26.63 kg/m  Constitutional: Well nourished, well developed female in no acute distress.  HEENT: normal Skin: Warm and dry.  Extremity: no edema  Abdomen: Soft, non-tender, normal bowel sounds; no bruits, organomegaly or masses.  Pelvic exam: (female chaperone present) NEFG, vagina pink and normally rugated.   At the vaginal cuff, no granulation tissue noted. No bleeding. No other abnormalities.   Assessment: 37 y.o. s/p TLH/BS/Cysto with granulation tissue after surgery, resolved as of today.   Plan: Follow up with routine check ups from this point on or PRN  Return in about 1 year (around 08/11/2019) for Annual Gynecologic Examination.   Prentice Docker, MD 08/10/2018, 9:12 AM

## 2018-11-28 ENCOUNTER — Telehealth: Payer: Self-pay

## 2018-11-28 NOTE — Telephone Encounter (Signed)
Pt calling triage reporting that she has BV can a rx be sent to her pharmacy. Please advise

## 2018-11-28 NOTE — Telephone Encounter (Signed)
Phone visit.

## 2018-11-29 NOTE — Telephone Encounter (Signed)
Patient is schedule 11/30/18 with SDJ

## 2018-11-30 ENCOUNTER — Encounter: Payer: Self-pay | Admitting: Obstetrics and Gynecology

## 2018-11-30 ENCOUNTER — Ambulatory Visit (INDEPENDENT_AMBULATORY_CARE_PROVIDER_SITE_OTHER): Payer: BC Managed Care – PPO | Admitting: Obstetrics and Gynecology

## 2018-11-30 ENCOUNTER — Other Ambulatory Visit: Payer: Self-pay

## 2018-11-30 DIAGNOSIS — N760 Acute vaginitis: Secondary | ICD-10-CM | POA: Diagnosis not present

## 2018-11-30 MED ORDER — METRONIDAZOLE 500 MG PO TABS
500.0000 mg | ORAL_TABLET | Freq: Two times a day (BID) | ORAL | 0 refills | Status: AC
Start: 2018-11-30 — End: 2018-12-07

## 2018-11-30 NOTE — Progress Notes (Signed)
Virtual Visit via Telephone Note  I connected with Andrea Decker on 11/30/18 at  8:50 AM EDT by telephone and verified that I am speaking with the correct person using two identifiers.   I discussed the limitations, risks, security and privacy concerns of performing an evaluation and management service by telephone and the availability of in person appointments. I also discussed with the patient that there may be a patient responsible charge related to this service. The patient expressed understanding and agreed to proceed.  She was at home and I was in my office.   History of Present Illness: 38 y.o. G71P2013 female Vaginitis: Patient complains of an abnormal vaginal discharge for 2 weeks. Vaginal symptoms include odor and vulvar itching.Vulvar symptoms include local irritation and vulvar itching.STI Risk: some risk of exposure of STI one month ago.  Discharge described as: heavier than normal, otherwise normal in consistency.Other associated symptoms: none. She tried miconazole ovules.  She had a little relief, but this seems to be coming back.    Observations/Objective: No exam today, due to telephone eVisit due to [facility] virus restriction on elective visits and procedures.  Prior visits reviewed along with ultrasounds/labs as indicated.  Assessment and Plan: 38 y.o. G9P2013 female who is being treated presumptively for  1. Acute vaginitis   - Prescription for flagyl 500mg  po bid x 7 days.  If sx continue, need in-office visit for exam.   Follow Up Instructions:   I discussed the assessment and treatment plan with the patient. The patient was provided an opportunity to ask questions and all were answered. The patient agreed with the plan and demonstrated an understanding of the instructions.   The patient was advised to call back or seek an in-person evaluation if the symptoms worsen or if the condition fails to improve as anticipated.  I provided 14 minutes of non-face-to-face time  during this encounter.  Prentice Docker, MD, [facility], [facility] 11/30/2018 9:26 AM

## 2018-12-23 ENCOUNTER — Other Ambulatory Visit: Payer: Self-pay

## 2018-12-23 ENCOUNTER — Encounter: Payer: Self-pay | Admitting: Obstetrics and Gynecology

## 2018-12-23 ENCOUNTER — Ambulatory Visit (INDEPENDENT_AMBULATORY_CARE_PROVIDER_SITE_OTHER): Payer: BC Managed Care – PPO | Admitting: Obstetrics and Gynecology

## 2018-12-23 ENCOUNTER — Other Ambulatory Visit (HOSPITAL_COMMUNITY)
Admission: RE | Admit: 2018-12-23 | Discharge: 2018-12-23 | Disposition: A | Discharge status: Home / Self Care | Payer: BC Managed Care – PPO | Source: Ambulatory Visit | Attending: Obstetrics and Gynecology | Admitting: Obstetrics and Gynecology

## 2018-12-23 DIAGNOSIS — N939 Abnormal uterine and vaginal bleeding, unspecified: Secondary | ICD-10-CM | POA: Diagnosis not present

## 2018-12-23 DIAGNOSIS — Z113 Encounter for screening for infections with a predominantly sexual mode of transmission: Secondary | ICD-10-CM | POA: Insufficient documentation

## 2018-12-23 NOTE — Progress Notes (Signed)
Obstetrics & Gynecology Office Visit   Chief Complaint  Patient presents with  . Follow-up   History of Present Illness: 38 y.o. G92P2013 female who is status post TLH/BS last summer.  She had some vaginal symptoms about 3 weeks ago, which were treated with metronidazole empirically.  She had relief of the symptoms until yesterday she noticed some light pink spotting along with a little abdominal pressure.  She also noted that she had a tender spot on her left vulva yesterday, which was a little further back closer to her anal area.  This is resolved as of today.  Also, she is having no bleeding today.  She denies any urinary symptoms.  She has no hemorrhoids and no history of the above.  She states that she had an encounter about 2 months ago with a new partner.  She had a conversation with that partner regarding risk of STDs, which resulted in her being reassured.  However, with the bleeding she would like to be sure.  Past Medical History:  Diagnosis Date  . Adenomyosis   . Dysrhythmia    hx of svt, evaluated by md but nothing ongoing now  . Goiter   . Hypothyroidism   . Mitral valve prolapse   . PONV (postoperative nausea and vomiting)    nauseated    Past Surgical History:  Procedure Laterality Date  . CYSTOSCOPY N/A 03/15/2018   Procedure: CYSTOSCOPY;  Surgeon: Will Bonnet, MD;  Location: [facility] ORS;  Service: Gynecology;  Laterality: N/A;  . LAPAROSCOPIC HYSTERECTOMY Bilateral 03/15/2018   Procedure: HYSTERECTOMY TOTAL LAPAROSCOPIC BILATERAL SALPINGECTOMY;  Surgeon: Will Bonnet, MD;  Location: [facility] ORS;  Service: Gynecology;  Laterality: Bilateral;  . THYROIDECTOMY  2014  . WISDOM TOOTH EXTRACTION      Gynecologic History: No LMP recorded. (Menstrual status: Irregular Periods).  Obstetric History: [id number]  Family History  Problem Relation Age of Onset  . Diabetes Father   . Bladder Cancer Father   . Ovarian cancer Maternal Aunt   . Breast cancer Maternal  Grandmother   . Colon cancer Maternal Uncle     Social History   Socioeconomic History  . Marital status: Divorced    Spouse name: Not on file  . Number of children: Not on file  . Years of education: Not on file  . Highest education level: Not on file  Occupational History  . Occupation: Nurse  Social Needs  . Financial resource strain: Not on file  . Food insecurity:    Worry: Not on file    Inability: Not on file  . Transportation needs:    Medical: Not on file    Non-medical: Not on file  Tobacco Use  . Smoking status: Former Smoker    Packs/day: 1.00    Years: 17.00    Pack years: 17.00    Types: Cigarettes    Last attempt to quit: 10/29/2013    Years since quitting: 5.1  . Smokeless tobacco: Never Used  Substance and Sexual Activity  . Alcohol use: Yes    Comment: rarely  . Drug use: No  . Sexual activity: Yes    Birth control/protection: I.U.D.  Lifestyle  . Physical activity:    Days per week: 4 days    Minutes per session: Not on file  . Stress: Not on file  Relationships  . Social connections:    Talks on phone: Not on file    Gets together: Not on file    Attends religious service:  Not on file    Active member of club or organization: Not on file    Attends meetings of clubs or organizations: Not on file    Relationship status: Not on file  . Intimate partner violence:    Fear of current or ex partner: Not on file    Emotionally abused: Not on file    Physically abused: Not on file    Forced sexual activity: Not on file  Other Topics Concern  . Not on file  Social History Narrative  . Not on file    No Known Allergies  Prior to Admission medications   Medication Sig Start Date End Date Taking? Authorizing Provider  levothyroxine (SYNTHROID, LEVOTHROID) 125 MCG tablet Take by mouth. 04/27/18 04/27/19  [provider]  levothyroxine (SYNTHROID, LEVOTHROID) 137 MCG tablet TAKE (1) TABLET BY MOUTH EVERY DAY AT 6:00 10/09/16   [provider]    Review of Systems  Constitutional: Negative.   HENT: Negative.   Eyes: Negative.   Respiratory: Negative.   Cardiovascular: Negative.   Gastrointestinal: Negative.   Genitourinary: Negative.        See HPI  Musculoskeletal: Negative.   Skin: Negative.   Neurological: Negative.   Psychiatric/Behavioral: Negative.      Physical Exam There were no vitals taken for this visit. No LMP recorded. (Menstrual status: Irregular Periods). Physical Exam Constitutional:      General: She is not in acute distress.    Appearance: Normal appearance. She is well-developed.  Genitourinary:     Pelvic exam was performed with patient in the lithotomy position.     Vulva, inguinal canal, urethra, bladder, right adnexa and left adnexa normal.     No posterior fourchette tenderness, injury or lesion present.     There are lesions in the vagina.     No signs of injury in the vagina.     Vaginal ulceration and rugosity present.     No vaginal discharge, erythema, tenderness or bleeding.     No foreign body in the vagina.     Cervix is absent.     Uterus is absent.     Genitourinary Comments: On left aspect of vaginal cuff there is a small, erosion vs ulceration that is not erythematous and not actively bleeding. Even with irritation for the cotton swab she had no bleeding.  Sample taken for STDs and for HSV.   HENT:     Head: Normocephalic and atraumatic.  Eyes:     General: No scleral icterus.    Conjunctiva/sclera: Conjunctivae normal.  Neck:     Musculoskeletal: Normal range of motion and neck supple.  Cardiovascular:     Rate and Rhythm: Normal rate and regular rhythm.     Heart sounds: No murmur. No friction rub. No gallop.   Pulmonary:     Effort: Pulmonary effort is normal. No respiratory distress.     Breath sounds: Normal breath sounds. No wheezing or rales.  Abdominal:     General: Bowel sounds are normal. There is no distension.     Palpations: Abdomen is soft.  There is no mass.     Tenderness: There is no abdominal tenderness. There is no guarding or rebound.  Musculoskeletal: Normal range of motion.  Neurological:     General: No focal deficit present.     Mental Status: She is alert and oriented to person, place, and time.     Cranial Nerves: No cranial nerve deficit.  Skin:  General: Skin is warm and dry.     Findings: No erythema.  Psychiatric:        Mood and Affect: Mood normal.        Behavior: Behavior normal.        Judgment: Judgment normal.     Female chaperone present for pelvic and breast  portions of the physical exam  Assessment: 38 y.o. G48P2013 female here for  1. Screen for STD (sexually transmitted disease)   2. Nonmenstrual vaginal bleeding      Plan: Problem List Items Addressed This Visit    None    Visit Diagnoses    Screen for STD (sexually transmitted disease)    -  Primary   Relevant Orders   Cervicovaginal ancillary only   Herpes simplex virus culture   Nonmenstrual vaginal bleeding         No evidence of bleeding today.  The only area of concern on exam today is a small erosion or ulceration on the left aspect of the vaginal cuff.  It does not appear to be actively inflamed.  A swab was utilized to test for herpes simplex.  Further, she was tested for general STDs, including; gonorrhea, chlamydia, and trichomonas.  We discussed serum testing for HIV and syphilis.  She declined for today.  15 minutes spent in face to face discussion with > 50% spent in counseling,management, and coordination of care of her screen for std and non-menstrual vaginal bleeding.   Prentice Docker, MD 12/23/2018 12:40 PM

## 2018-12-27 LAB — HERPES SIMPLEX VIRUS CULTURE

## 2018-12-27 LAB — CERVICOVAGINAL ANCILLARY ONLY
Chlamydia: NEGATIVE
Neisseria Gonorrhea: NEGATIVE
Trichomonas: NEGATIVE

## 2019-01-05 ENCOUNTER — Other Ambulatory Visit: Payer: Self-pay | Admitting: Obstetrics and Gynecology

## 2019-01-05 DIAGNOSIS — N760 Acute vaginitis: Principal | ICD-10-CM

## 2019-01-05 DIAGNOSIS — B9689 Other specified bacterial agents as the cause of diseases classified elsewhere: Secondary | ICD-10-CM

## 2019-01-05 MED ORDER — METRONIDAZOLE 500 MG PO TABS
500.0000 mg | ORAL_TABLET | Freq: Two times a day (BID) | ORAL | 0 refills | Status: AC
Start: 2019-01-05 — End: 2019-01-12

## 2019-05-29 ENCOUNTER — Ambulatory Visit: Payer: BC Managed Care – PPO | Admitting: Obstetrics and Gynecology

## 2020-07-03 ENCOUNTER — Telehealth: Payer: Self-pay

## 2020-07-03 NOTE — Telephone Encounter (Signed)
Pt aware per [facility] if continues to make appt; certainly make appt if becomes bright red and like a period; pt asked continue for how long - adv a week or two.

## 2020-07-03 NOTE — Telephone Encounter (Signed)
Pt calling; had hyst 2018; today has a pink d/c; pt is confused by this; should she be concerned?  [phone]
